# Patient Record
Sex: Male | Born: 1955 | Race: Black or African American | Hispanic: No | Smoking: Current every day smoker
Health system: Southern US, Community
[De-identification: ages and names within clinical notes are randomized; demographics above are authoritative.]

## PROBLEM LIST (undated history)

## (undated) DIAGNOSIS — E049 Nontoxic goiter, unspecified: Secondary | ICD-10-CM

## (undated) DIAGNOSIS — M1990 Unspecified osteoarthritis, unspecified site: Secondary | ICD-10-CM

## (undated) DIAGNOSIS — K219 Gastro-esophageal reflux disease without esophagitis: Secondary | ICD-10-CM

## (undated) DIAGNOSIS — I10 Essential (primary) hypertension: Secondary | ICD-10-CM

## (undated) HISTORY — PX: HERNIA REPAIR: SHX51

## (undated) HISTORY — PX: HIP SURGERY: SHX245

---

## 2014-07-19 ENCOUNTER — Other Ambulatory Visit: Payer: Self-pay | Admitting: Orthopedic Surgery

## 2014-07-19 NOTE — Progress Notes (Signed)
Preoperative surgical orders have been place into the [facility] hospital system for Cody Morales on 07/19/2014, 12:52 PM  by Patrica Duel for surgery on 08/15/2014.  Preop Total Hip orders including Experel Injecion, IV Tylenol, and IV Decadron as long as there are no contraindications to the above medications. Avel Peace, PA-C

## 2014-07-31 ENCOUNTER — Other Ambulatory Visit: Payer: Self-pay | Admitting: Orthopedic Surgery

## 2014-07-31 NOTE — H&P (Signed)
Cody Morales DOB: 1956-08-08 Divorced / Language: [place] / Race: Black or African American, White Male Date of Admission: 08-15-2014 Chief Complaint:  Right Hip Pain History of Present Illness  The patient is a 58 year old male who comes in for a preoperative history and physical. The patient is scheduled for a right total hip arthroplasty to be performed by Dr. Gus Rankin. Aluisio, MD at [facility] on 08/15/2014. The patient reports left hip and right hip problems including pain, stiffness and decreased range of motion symptoms that have been present for year(s). 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 He has had surgeries in the past hip pinnings and then the had hardware taken out. He was doing well but over time has started to have increased soreness and stiffness. In the last two years he has lost range of motion. He was told he needed hip replacements and was referred to Dr.Aluisio. He is currently taking hydrocodone as needed.).The patient feels as if their symptoms are does feel they are worsening. Unfortunately, he has severe problems with both hips. His right hip is currently more symptomatic and problematic than the left. He has pain in both occurring at all times. This definitely limits his mobility and limits what he can and can not do. The problems date back to his teen years when he had pinning for slipped capital femoral epiphysis. He had a subsequent hardware removal. He has had rotational osteotomies. He is at a stage now where his hips hurt him at all times. He can not do anything he desires. At this point, the only predictable means of improving his pain and function will be a total hip arthroplasty. He is not a candidate for an anterior approach on the right given the significant deformity and the essentially fused hip. He will have to have a posterior approach. We did discuss this in detail, including the  procedure, risks,potential complications and rehab course. He wants to go ahead and proceed with a right total hip arthroplasty first. He can eventually have the left hip done and that could probably be done via an anterior approach. They have been treated conservatively in the past for the above stated problem and despite conservative measures, they continue to have progressive pain and severe functional limitations and dysfunction. They have failed non-operative management including home exercise, medications, and injections. It is felt that they would benefit from undergoing total joint replacement. Risks and benefits of the procedure have been discussed with the patient and they elect to proceed with surgery. There are no active contraindications to surgery such as ongoing infection or rapidly progressive neurological disease.  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 Heart disease in male family member before age 57 First Degree Relatives reported  Social History  No history of drug/alcohol rehab Not under pain contract Marital status divorced Exercise Exercises rarely; does running / walking Living situation live with partner Tobacco use Current some day smoker. 05/01/2014: smoke(d) 3 or more pack(s) per day Number of flights of stairs before winded 2-3 Tobacco / smoke exposure 05/01/2014: yes Current drinker 05/01/2014: Currently drinks wine 5-7 times per week Current work status retired Children 1  Medication History Diclofenac Sodium ER (  Tablet ER 24HR, Oral) Active. Hydrocodone-Acetaminophen (5-325MG  Tablet, Oral) Active.  Nifedical XL (  Tablet ER 24HR, Oral) Active. TraZODone HCl (  Tablet, Oral daily) Active. TraMADol HCl Active.  Past  Surgical History Inguinal Hernia Repair open: bilateral Bilateral Hip Pinning for SCFE's   Review of Systems General Not Present- Chills, Fatigue, Fever, Memory Loss, Night Sweats, Weight Gain and Weight Loss. Skin Not Present- Eczema, Hives, Itching, Lesions and Rash. HEENT Not Present- Dentures, Double Vision, Headache, Hearing Loss, Tinnitus and Visual Loss. Respiratory Not Present- Allergies, Chronic Cough, Coughing up blood, Shortness of breath at rest and Shortness of breath with exertion. Cardiovascular Not Present- Chest Pain, Difficulty Breathing Lying Down, Murmur, Palpitations, Racing/skipping heartbeats and Swelling. Gastrointestinal Not Present- Abdominal Pain, Bloody Stool, Constipation, Diarrhea, Difficulty Swallowing, Heartburn, Jaundice, Loss of appetitie, Nausea and Vomiting. Male Genitourinary Not Present- Blood in Urine, Discharge, Flank Pain, Incontinence, Painful Urination, Urgency, Urinary frequency, Urinary Retention, Urinating at Night and Weak urinary stream. Musculoskeletal Present- Morning Stiffness and Muscle Pain. Not Present- Back Pain, Joint Pain, Joint Swelling, Muscle Weakness and Spasms. Neurological Not Present- Blackout spells, Difficulty with balance, Dizziness, Paralysis, Tremor and Weakness. Psychiatric Not Present- Insomnia.   Vitals Weight: 216 lb Height: 70in Body Surface Area: 2.2 m Body Mass Index: 30.99 kg/m BP: 124/92 (Sitting, Right Arm, Standard)  Physical Exam  General Mental Status -Alert, cooperative and good historian. General Appearance-pleasant, Not in acute distress. Orientation-Oriented X3. Build & Nutrition-Well nourished and Well developed. Gait-Antalgic and Limping.  Head and Neck Head-normocephalic, atraumatic . Neck Global Assessment - supple, no bruit auscultated on the right, no bruit auscultated on the left.  Eye Pupil - Bilateral-Regular and Round. Motion - Bilateral-EOMI.  Chest and  Lung Exam Auscultation Breath sounds - clear at anterior chest wall and clear at posterior chest wall. Adventitious sounds - No Adventitious sounds.  Cardiovascular Auscultation Rhythm - Regular rate and rhythm. Heart Sounds - S1 WNL and S2 WNL. Murmurs & Other Heart Sounds - Auscultation of the heart reveals - No Murmurs.  Abdomen Palpation/Percussion Tenderness - Abdomen is non-tender to palpation. Rigidity (guarding) - Abdomen is soft. Auscultation Auscultation of the abdomen reveals - Bowel sounds normal.  Male Genitourinary Note: Not done, not pertinent to present illness   Musculoskeletal Note: He is a well developed male. He is alert and oriented. No apparent distress. The right hip has flexion to about 70, zero internal or external rotation. No abduction. The left hip flexion is to about 90, about 5-10 external rotation and about 10 degrees of abduction. The knee exams are normal. Pulse, sensation and motor are intact in both lower extremities. He has a very stiff legged antalgic gait pattern.  RADIOGRAPHS: AP pelvis and lateral of both hips show severe end stage arthritic change, right worse than left hip. 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 On the left he also has severe bone on bone arthritis with subchondral cystic formation but less deformity.  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 Lequita Halt.  Plan is to go home.  PCP - Dr. Liliane Shi  The patient will receive topical TXA (tranexamic acid) due to: DVT in 1996  Signed electronically by Zackarey Holleman Tessie Fass, III PA-C

## 2014-08-02 ENCOUNTER — Encounter (HOSPITAL_COMMUNITY): Payer: Self-pay | Admitting: Pharmacy Technician

## 2014-08-08 NOTE — Patient Instructions (Addendum)
Cody Morales  08/08/2014   Your procedure is scheduled on:  08/15/2014    Report to [facility].  Follow the Signs to Short Stay Center at   230pm  Call this number if you have problems the morning of surgery: [phone]   Remember:   Do not eat food after midnite.  May have clear liquids until 0930am then npo.    Take these medicines the morning of surgery with A SIP OF WATER: Procardia    Do not wear jewelry,   Do not wear lotions, powders, or perfumes.  deodorant.  . Men may shave face and neck.  Do not bring valuables to the hospital.  Contacts, dentures or bridgework may not be worn into surgery.  Leave suitcase in the car. After surgery it may be brought to your room.  For patients admitted to the hospital, checkout time is 11:00 AM the day of  discharge.          CLEAR LIQUID DIET   Foods Allowed                                                                     Foods Excluded  Coffee and tea, regular and decaf                             liquids that you cannot  Plain Jell-O in any flavor                                             see through such as: Fruit ices (not with fruit pulp)                                     milk, soups, orange juice  Iced Popsicles                                    All solid food Carbonated beverages, regular and diet                                    Cranberry, grape and apple juices Sports drinks like Gatorade Lightly seasoned clear broth or consume(fat free) Sugar, honey syrup  Sample Menu Breakfast                                Lunch                                     Supper Cranberry juice                    Beef broth  Chicken broth Jell-O                                     Grape juice                           Apple juice Coffee or tea                        Jell-O                                      Popsicle                                                Coffee or tea                         Coffee or tea  _____________________________________________________________________  [facility] - Preparing for Surgery Before surgery, you can play an important role.  Because skin is not sterile, your skin needs to be as free of germs as possible.  You can reduce the number of germs on your skin by washing with CHG (chlorahexidine gluconate) soap before surgery.  CHG is an antiseptic cleaner which kills germs and bonds with the skin to continue killing germs even after washing. Please DO NOT use if you have an allergy to CHG or antibacterial soaps.  If your skin becomes reddened/irritated stop using the CHG and inform your nurse when you arrive at Short Stay. Do not shave (including legs and underarms) for at least 48 hours prior to the first CHG shower.  You may shave your face/neck. Please follow these instructions carefully:  1.  Shower with CHG Soap the night before surgery and the  morning of Surgery.  2.  If you choose to wash your hair, wash your hair first as usual with your  normal  shampoo.  3.  After you shampoo, rinse your hair and body thoroughly to remove the  shampoo.                           4.  Use CHG as you would any other liquid soap.  You can apply chg directly  to the skin and wash                       Gently with a scrungie or clean washcloth.  5.  Apply the CHG Soap to your body ONLY FROM THE NECK DOWN.   Do not use on face/ open                           Wound or open sores. Avoid contact with eyes, ears mouth and genitals (private parts).                       Wash face,  Genitals (private parts) with your normal soap.             6.  Wash thoroughly, paying special attention to the area  where your surgery  will be performed.  7.  Thoroughly rinse your body with warm water from the neck down.  8.  DO NOT shower/wash with your normal soap after using and rinsing off  the CHG Soap.                9.  Pat yourself dry with a clean towel.            10.  Wear  clean pajamas.            11.  Place clean sheets on your bed the night of your first shower and do not  sleep with pets. Day of Surgery : Do not apply any lotions/deodorants the morning of surgery.  Please wear clean clothes to the hospital/surgery center.  FAILURE TO FOLLOW THESE INSTRUCTIONS MAY RESULT IN THE CANCELLATION OF YOUR SURGERY PATIENT SIGNATURE_________________________________  NURSE SIGNATURE__________________________________  ________________________________________________________________________  WHAT IS A BLOOD TRANSFUSION? Blood Transfusion Information  A transfusion is the replacement of blood or some of its parts. Blood is made up of multiple cells which provide different functions.  Red blood cells carry oxygen and are used for blood loss replacement.  White blood cells fight against infection.  Platelets control bleeding.  Plasma helps clot blood.  Other blood products are available for specialized needs, such as hemophilia or other clotting disorders. BEFORE THE TRANSFUSION  Who gives blood for transfusions?   Healthy volunteers who are fully evaluated to make sure their blood is safe. This is blood bank blood. Transfusion therapy is the safest it has ever been in the practice of medicine. Before blood is taken from a donor, a complete history is taken to make sure that person has no history of diseases nor engages in risky social behavior (examples are intravenous drug use or sexual activity with multiple partners). The donor's travel history is screened to minimize risk of transmitting infections, such as malaria. The donated blood is tested for signs of infectious diseases, such as HIV and hepatitis. The blood is then tested to be sure it is compatible with you in order to minimize the chance of a transfusion reaction. If you or a relative donates blood, this is often done in anticipation of surgery and is not appropriate for emergency situations. It takes  many days to process the donated blood. RISKS AND COMPLICATIONS Although transfusion therapy is very safe and saves many lives, the main dangers of transfusion include:   Getting an infectious disease.  Developing a transfusion reaction. This is an allergic reaction to something in the blood you were given. Every precaution is taken to prevent this. The decision to have a blood transfusion has been considered carefully by your caregiver before blood is given. Blood is not given unless the benefits outweigh the risks. AFTER THE TRANSFUSION  Right after receiving a blood transfusion, you will usually feel much better and more energetic. This is especially true if your red blood cells have gotten low (anemic). The transfusion raises the level of the red blood cells which carry oxygen, and this usually causes an energy increase.  The nurse administering the transfusion will monitor you carefully for complications. HOME CARE INSTRUCTIONS  No special instructions are needed after a transfusion. You may find your energy is better. Speak with your caregiver about any limitations on activity for underlying diseases you may have. SEEK MEDICAL CARE IF:   Your condition is not improving after your transfusion.  You develop redness or irritation at the intravenous (  IV) site. SEEK IMMEDIATE MEDICAL CARE IF:  Any of the following symptoms occur over the next 12 hours:  Shaking chills.  You have a temperature by mouth above 102 F (38.9 C), not controlled by medicine.  Chest, back, or muscle pain.  People around you feel you are not acting correctly or are confused.  Shortness of breath or difficulty breathing.  Dizziness and fainting.  You get a rash or develop hives.  You have a decrease in urine output.  Your urine turns a dark color or changes to pink, red, or brown. Any of the following symptoms occur over the next 10 days:  You have a temperature by mouth above 102 F (38.9 C), not  controlled by medicine.  Shortness of breath.  Weakness after normal activity.  The white part of the eye turns yellow (jaundice).  You have a decrease in the amount of urine or are urinating less often.  Your urine turns a dark color or changes to pink, red, or brown. Document Released: 10/30/2000 Document Revised: 01/25/2012 Document Reviewed: 06/18/2008 Exit[employer] Patient Information 2014 [place].  _______________________________________________________________________  Incentive Spirometer  An incentive spirometer is a tool that can help keep your lungs clear and active. This tool measures how well you are filling your lungs with each breath. Taking long deep breaths may help reverse or decrease the chance of developing breathing (pulmonary) problems (especially infection) following:  A long period of time when you are unable to move or be active. BEFORE THE PROCEDURE   If the spirometer includes an indicator to show your best effort, your nurse or respiratory therapist will set it to a desired goal.  If possible, sit up straight or lean slightly forward. Try not to slouch.  Hold the incentive spirometer in an upright position. INSTRUCTIONS FOR USE  1. Sit on the edge of your bed if possible, or sit up as far as you can in bed or on a chair. 2. Hold the incentive spirometer in an upright position. 3. Breathe out normally. 4. Place the mouthpiece in your mouth and seal your lips tightly around it. 5. Breathe in slowly and as deeply as possible, raising the piston or the ball toward the top of the column. 6. Hold your breath for 3-5 seconds or for as long as possible. Allow the piston or ball to fall to the bottom of the column. 7. Remove the mouthpiece from your mouth and breathe out normally. 8. Rest for a few seconds and repeat Steps 1 through 7 at least 10 times every 1-2 hours when you are awake. Take your time and take a few normal breaths between deep  breaths. 9. The spirometer may include an indicator to show your best effort. Use the indicator as a goal to work toward during each repetition. 10. After each set of 10 deep breaths, practice coughing to be sure your lungs are clear. If you have an incision (the cut made at the time of surgery), support your incision when coughing by placing a pillow or rolled up towels firmly against it. Once you are able to get out of bed, walk around indoors and cough well. You may stop using the incentive spirometer when instructed by your caregiver.  RISKS AND COMPLICATIONS  Take your time so you do not get dizzy or light-headed.  If you are in pain, you may need to take or ask for pain medication before doing incentive spirometry. It is harder to take a deep breath if you  are having pain. AFTER USE  Rest and breathe slowly and easily.  It can be helpful to keep track of a log of your progress. Your caregiver can provide you with a simple table to help with this. If you are using the spirometer at home, follow these instructions: SEEK MEDICAL CARE IF:   You are having difficultly using the spirometer.  You have trouble using the spirometer as often as instructed.  Your pain medication is not giving enough relief while using the spirometer.  You develop fever of 100.5 F (38.1 C) or higher. SEEK IMMEDIATE MEDICAL CARE IF:   You cough up bloody sputum that had not been present before.  You develop fever of 102 F (38.9 C) or greater.  You develop worsening pain at or near the incision site. MAKE SURE YOU:   Understand these instructions.  Will watch your condition.  Will get help right away if you are not doing well or get worse. Document Released: 03/15/2007 Document Revised: 01/25/2012 Document Reviewed: 05/16/2007 ExitCare Patient Information 2014 ExitCare, [place].   ________________________________________________________________________    Please read over the following fact  sheets that you were given: MRSA Information, coughing and deep breathing exercises, leg exercises

## 2014-08-09 ENCOUNTER — Encounter (INDEPENDENT_AMBULATORY_CARE_PROVIDER_SITE_OTHER): Payer: Self-pay

## 2014-08-09 ENCOUNTER — Ambulatory Visit (HOSPITAL_COMMUNITY)
Admission: RE | Admit: 2014-08-09 | Discharge: 2014-08-09 | Disposition: A | Discharge status: Home / Self Care | Payer: Medicare HMO | Source: Ambulatory Visit | Attending: Orthopedic Surgery | Admitting: Orthopedic Surgery

## 2014-08-09 ENCOUNTER — Encounter (HOSPITAL_COMMUNITY): Payer: Self-pay

## 2014-08-09 ENCOUNTER — Encounter (HOSPITAL_COMMUNITY)
Admission: RE | Admit: 2014-08-09 | Discharge: 2014-08-09 | Disposition: A | Discharge status: Home / Self Care | Payer: Medicare HMO | Source: Ambulatory Visit | Attending: Orthopedic Surgery | Admitting: Orthopedic Surgery

## 2014-08-09 DIAGNOSIS — I10 Essential (primary) hypertension: Secondary | ICD-10-CM | POA: Diagnosis present

## 2014-08-09 HISTORY — DX: Nontoxic goiter, unspecified: E04.9

## 2014-08-09 HISTORY — DX: Essential (primary) hypertension: I10

## 2014-08-09 HISTORY — DX: Gastro-esophageal reflux disease without esophagitis: K21.9

## 2014-08-09 HISTORY — DX: Unspecified osteoarthritis, unspecified site: M19.90

## 2014-08-09 LAB — CBC
HCT: 43.3 % (ref 39.0–52.0)
Hemoglobin: 14.9 g/dL (ref 13.0–17.0)
MCH: 32.2 pg (ref 26.0–34.0)
MCHC: 34.4 g/dL (ref 30.0–36.0)
MCV: 93.5 fL (ref 78.0–100.0)
PLATELETS: 170 10*3/uL (ref 150–400)
RBC: 4.63 MIL/uL (ref 4.22–5.81)
RDW: 13.7 % (ref 11.5–15.5)
WBC: 6.8 10*3/uL (ref 4.0–10.5)

## 2014-08-09 LAB — URINALYSIS, ROUTINE W REFLEX MICROSCOPIC
Glucose, UA: NEGATIVE mg/dL
Hgb urine dipstick: NEGATIVE
Ketones, ur: NEGATIVE mg/dL
LEUKOCYTES UA: NEGATIVE
Nitrite: NEGATIVE
Protein, ur: NEGATIVE mg/dL
SPECIFIC GRAVITY, URINE: 1.034 — AB (ref 1.005–1.030)
Urobilinogen, UA: 0.2 mg/dL (ref 0.0–1.0)
pH: 5.5 (ref 5.0–8.0)

## 2014-08-09 LAB — SURGICAL PCR SCREEN
MRSA, PCR: NEGATIVE
STAPHYLOCOCCUS AUREUS: POSITIVE — AB

## 2014-08-09 LAB — PROTIME-INR
INR: 1.01 (ref 0.00–1.49)
Prothrombin Time: 13.3 seconds (ref 11.6–15.2)

## 2014-08-09 LAB — COMPREHENSIVE METABOLIC PANEL
ALBUMIN: 3.7 g/dL (ref 3.5–5.2)
ALT: 16 U/L (ref 0–53)
AST: 19 U/L (ref 0–37)
Alkaline Phosphatase: 72 U/L (ref 39–117)
Anion gap: 13 (ref 5–15)
BILIRUBIN TOTAL: 0.3 mg/dL (ref 0.3–1.2)
BUN: 15 mg/dL (ref 6–23)
CHLORIDE: 102 meq/L (ref 96–112)
CO2: 23 meq/L (ref 19–32)
Calcium: 8.9 mg/dL (ref 8.4–10.5)
Creatinine, Ser: 0.85 mg/dL (ref 0.50–1.35)
GFR calc Af Amer: >90 mL/min (ref >90)
GFR calc non Af Amer: >90 mL/min (ref >90)
Glucose, Bld: 101 mg/dL — ABNORMAL HIGH (ref 70–99)
Potassium: 4.1 mEq/L (ref 3.7–5.3)
Sodium: 138 mEq/L (ref 137–147)
Total Protein: 7.3 g/dL (ref 6.0–8.3)

## 2014-08-09 LAB — ABO/RH: ABO/RH(D): O POS

## 2014-08-09 LAB — APTT: APTT: 26 s (ref 24–37)

## 2014-08-09 NOTE — Progress Notes (Signed)
Faxed medical release form to Dr Britt Bottom requesting last EKG, office visit note and labs.

## 2014-08-09 NOTE — Progress Notes (Signed)
Dr Jeronimo Greaves - 07/16/2014 clearance on chart

## 2014-08-10 NOTE — Progress Notes (Signed)
Rerequested info from Dr Jeronimo Greaves to include most recent ekg.  This is second request.

## 2014-08-10 NOTE — Progress Notes (Signed)
LOV note- Dr Sharion Settler - 06/2014 on chasrt  EKG-07/16/2014 on chart

## 2014-08-15 ENCOUNTER — Encounter (HOSPITAL_COMMUNITY): Payer: Medicare HMO | Admitting: Anesthesiology

## 2014-08-15 ENCOUNTER — Encounter (HOSPITAL_COMMUNITY)
Admission: RE | Disposition: A | Discharge status: Home Health | Payer: Self-pay | Source: Ambulatory Visit | Attending: Orthopedic Surgery

## 2014-08-15 ENCOUNTER — Inpatient Hospital Stay (HOSPITAL_COMMUNITY)
Admission: RE | Admit: 2014-08-15 | Discharge: 2014-08-19 | DRG: 470 | Disposition: A | Discharge status: Home Health | Payer: Medicare HMO | Source: Ambulatory Visit | Attending: Orthopedic Surgery | Admitting: Orthopedic Surgery

## 2014-08-15 ENCOUNTER — Inpatient Hospital Stay (HOSPITAL_COMMUNITY): Payer: Medicare HMO | Admitting: Anesthesiology

## 2014-08-15 ENCOUNTER — Encounter (HOSPITAL_COMMUNITY): Payer: Self-pay | Admitting: *Deleted

## 2014-08-15 ENCOUNTER — Inpatient Hospital Stay (HOSPITAL_COMMUNITY): Payer: Medicare HMO

## 2014-08-15 DIAGNOSIS — Z86718 Personal history of other venous thrombosis and embolism: Secondary | ICD-10-CM | POA: Diagnosis not present

## 2014-08-15 DIAGNOSIS — M1610 Unilateral primary osteoarthritis, unspecified hip: Secondary | ICD-10-CM | POA: Diagnosis not present

## 2014-08-15 DIAGNOSIS — F17210 Nicotine dependence, cigarettes, uncomplicated: Secondary | ICD-10-CM | POA: Diagnosis present

## 2014-08-15 DIAGNOSIS — Z8739 Personal history of other diseases of the musculoskeletal system and connective tissue: Secondary | ICD-10-CM

## 2014-08-15 DIAGNOSIS — M169 Osteoarthritis of hip, unspecified: Secondary | ICD-10-CM | POA: Diagnosis present

## 2014-08-15 DIAGNOSIS — Z8249 Family history of ischemic heart disease and other diseases of the circulatory system: Secondary | ICD-10-CM | POA: Diagnosis not present

## 2014-08-15 DIAGNOSIS — M1611 Unilateral primary osteoarthritis, right hip: Principal | ICD-10-CM | POA: Diagnosis present

## 2014-08-15 DIAGNOSIS — K219 Gastro-esophageal reflux disease without esophagitis: Secondary | ICD-10-CM | POA: Diagnosis present

## 2014-08-15 DIAGNOSIS — I10 Essential (primary) hypertension: Secondary | ICD-10-CM | POA: Diagnosis present

## 2014-08-15 DIAGNOSIS — Z6830 Body mass index (BMI) 30.0-30.9, adult: Secondary | ICD-10-CM | POA: Diagnosis not present

## 2014-08-15 DIAGNOSIS — M25551 Pain in right hip: Secondary | ICD-10-CM | POA: Diagnosis present

## 2014-08-15 DIAGNOSIS — F17200 Nicotine dependence, unspecified, uncomplicated: Secondary | ICD-10-CM | POA: Diagnosis not present

## 2014-08-15 DIAGNOSIS — M1651 Unilateral post-traumatic osteoarthritis, right hip: Secondary | ICD-10-CM

## 2014-08-15 HISTORY — PX: TOTAL HIP ARTHROPLASTY: SHX124

## 2014-08-15 LAB — TYPE AND SCREEN
ABO/RH(D): O POS
ANTIBODY SCREEN: NEGATIVE

## 2014-08-15 SURGERY — ARTHROPLASTY, HIP, TOTAL,POSTERIOR APPROACH
Anesthesia: General | Site: Hip | Laterality: Right

## 2014-08-15 MED ORDER — HYDROMORPHONE HCL 2 MG/ML IJ SOLN
INTRAMUSCULAR | Status: AC
  Filled 2014-08-15: qty 1

## 2014-08-15 MED ORDER — CHLORHEXIDINE GLUCONATE 4 % EX LIQD: 60.0000 mL | Freq: Once | CUTANEOUS | Status: DC

## 2014-08-15 MED ORDER — DOCUSATE SODIUM 100 MG PO CAPS
100.0000 mg | ORAL_CAPSULE | Freq: Two times a day (BID) | ORAL | Status: DC
  Administered 2014-08-15 – 2014-08-19 (×8): 100 mg via ORAL

## 2014-08-15 MED ORDER — ACETAMINOPHEN 325 MG PO TABS
650.0000 mg | ORAL_TABLET | Freq: Four times a day (QID) | ORAL | Status: DC | PRN
  Administered 2014-08-17: 650 mg via ORAL
  Filled 2014-08-15: qty 2

## 2014-08-15 MED ORDER — GLYCOPYRROLATE 0.2 MG/ML IJ SOLN
INTRAMUSCULAR | Status: AC
Start: 2014-08-15 — End: 2014-08-15
  Filled 2014-08-15: qty 3

## 2014-08-15 MED ORDER — CEFAZOLIN SODIUM-DEXTROSE 2-3 GM-% IV SOLR
2.0000 g | Freq: Four times a day (QID) | INTRAVENOUS | Status: AC
  Administered 2014-08-15 – 2014-08-16 (×2): 2 g via INTRAVENOUS
  Filled 2014-08-15 (×2): qty 50

## 2014-08-15 MED ORDER — ONDANSETRON HCL 4 MG/2ML IJ SOLN
INTRAMUSCULAR | Status: DC | PRN
  Administered 2014-08-15: 4 mg via INTRAVENOUS

## 2014-08-15 MED ORDER — POLYETHYLENE GLYCOL 3350 17 G PO PACK: 17.0000 g | PACK | Freq: Every day | ORAL | Status: DC | PRN

## 2014-08-15 MED ORDER — RIVAROXABAN 10 MG PO TABS
10.0000 mg | ORAL_TABLET | Freq: Every day | ORAL | Status: DC
  Administered 2014-08-16 – 2014-08-19 (×4): 10 mg via ORAL
  Filled 2014-08-15 (×5): qty 1

## 2014-08-15 MED ORDER — MENTHOL 3 MG MT LOZG
1.0000 | LOZENGE | OROMUCOSAL | Status: DC | PRN
Start: 2014-08-15 — End: 2014-08-19
  Filled 2014-08-15: qty 9

## 2014-08-15 MED ORDER — MORPHINE SULFATE 2 MG/ML IJ SOLN
INTRAMUSCULAR | Status: AC
  Filled 2014-08-15: qty 1

## 2014-08-15 MED ORDER — OXYCODONE HCL 5 MG PO TABS
5.0000 mg | ORAL_TABLET | ORAL | Status: DC | PRN
  Administered 2014-08-16 – 2014-08-19 (×17): 10 mg via ORAL
  Filled 2014-08-15 (×17): qty 2

## 2014-08-15 MED ORDER — DIPHENHYDRAMINE HCL 12.5 MG/5ML PO ELIX: 12.5000 mg | ORAL_SOLUTION | ORAL | Status: DC | PRN

## 2014-08-15 MED ORDER — METHOCARBAMOL 1000 MG/10ML IJ SOLN
500.0000 mg | Freq: Four times a day (QID) | INTRAVENOUS | Status: DC | PRN
  Administered 2014-08-15: 500 mg via INTRAVENOUS
  Filled 2014-08-15: qty 5

## 2014-08-15 MED ORDER — ACETAMINOPHEN 500 MG PO TABS
1000.0000 mg | ORAL_TABLET | Freq: Four times a day (QID) | ORAL | Status: AC
  Administered 2014-08-15 – 2014-08-16 (×4): 1000 mg via ORAL
  Filled 2014-08-15 (×5): qty 2

## 2014-08-15 MED ORDER — SODIUM CHLORIDE 0.9 % IV SOLN
INTRAVENOUS | Status: DC
Start: 2014-08-15 — End: 2014-08-15

## 2014-08-15 MED ORDER — FAMOTIDINE 40 MG PO TABS
40.0000 mg | ORAL_TABLET | Freq: Every day | ORAL | Status: DC | PRN
  Filled 2014-08-15: qty 1

## 2014-08-15 MED ORDER — BUPIVACAINE LIPOSOME 1.3 % IJ SUSP
20.0000 mL | Freq: Once | INTRAMUSCULAR | Status: DC
  Filled 2014-08-15: qty 20

## 2014-08-15 MED ORDER — DEXAMETHASONE SODIUM PHOSPHATE 10 MG/ML IJ SOLN
INTRAMUSCULAR | Status: AC
  Filled 2014-08-15: qty 1

## 2014-08-15 MED ORDER — CEFAZOLIN SODIUM-DEXTROSE 2-3 GM-% IV SOLR
INTRAVENOUS | Status: AC
  Filled 2014-08-15: qty 50

## 2014-08-15 MED ORDER — PHENOL 1.4 % MT LIQD
1.0000 | OROMUCOSAL | Status: DC | PRN
  Filled 2014-08-15: qty 177

## 2014-08-15 MED ORDER — LACTATED RINGERS IV SOLN: INTRAVENOUS | Status: DC

## 2014-08-15 MED ORDER — LIDOCAINE HCL (CARDIAC) 20 MG/ML IV SOLN
INTRAVENOUS | Status: DC | PRN
  Administered 2014-08-15: 75 mg via INTRAVENOUS

## 2014-08-15 MED ORDER — SUCCINYLCHOLINE CHLORIDE 20 MG/ML IJ SOLN
INTRAMUSCULAR | Status: DC | PRN
  Administered 2014-08-15: 100 mg via INTRAVENOUS

## 2014-08-15 MED ORDER — CISATRACURIUM BESYLATE 20 MG/10ML IV SOLN
INTRAVENOUS | Status: AC
  Filled 2014-08-15: qty 10

## 2014-08-15 MED ORDER — METOCLOPRAMIDE HCL 10 MG PO TABS: 5.0000 mg | ORAL_TABLET | Freq: Three times a day (TID) | ORAL | Status: DC | PRN

## 2014-08-15 MED ORDER — HYDROMORPHONE HCL 1 MG/ML IJ SOLN
0.2500 mg | INTRAMUSCULAR | Status: DC | PRN
  Administered 2014-08-15: 0.5 mg via INTRAVENOUS

## 2014-08-15 MED ORDER — BUPIVACAINE HCL (PF) 0.25 % IJ SOLN
INTRAMUSCULAR | Status: AC
  Filled 2014-08-15: qty 30

## 2014-08-15 MED ORDER — NEOSTIGMINE METHYLSULFATE 10 MG/10ML IV SOLN
INTRAVENOUS | Status: AC
  Filled 2014-08-15: qty 1

## 2014-08-15 MED ORDER — FLEET ENEMA 7-19 GM/118ML RE ENEM: 1.0000 | ENEMA | Freq: Once | RECTAL | Status: AC | PRN

## 2014-08-15 MED ORDER — FENTANYL CITRATE 0.05 MG/ML IJ SOLN
INTRAMUSCULAR | Status: DC | PRN
  Administered 2014-08-15: 100 ug via INTRAVENOUS
  Administered 2014-08-15: 50 ug via INTRAVENOUS
  Administered 2014-08-15 (×2): 100 ug via INTRAVENOUS

## 2014-08-15 MED ORDER — CEFAZOLIN SODIUM-DEXTROSE 2-3 GM-% IV SOLR
2.0000 g | INTRAVENOUS | Status: AC
  Administered 2014-08-15: 2 g via INTRAVENOUS

## 2014-08-15 MED ORDER — ONDANSETRON HCL 4 MG/2ML IJ SOLN
INTRAMUSCULAR | Status: AC
  Filled 2014-08-15: qty 2

## 2014-08-15 MED ORDER — BISACODYL 10 MG RE SUPP: 10.0000 mg | Freq: Every day | RECTAL | Status: DC | PRN

## 2014-08-15 MED ORDER — GLYCOPYRROLATE 0.2 MG/ML IJ SOLN
INTRAMUSCULAR | Status: DC | PRN
  Administered 2014-08-15: 0.6 mg via INTRAVENOUS

## 2014-08-15 MED ORDER — TRANEXAMIC ACID 100 MG/ML IV SOLN
2000.0000 mg | INTRAVENOUS | Status: DC | PRN
  Administered 2014-08-15: 2000 mg via TOPICAL

## 2014-08-15 MED ORDER — FENTANYL CITRATE 0.05 MG/ML IJ SOLN
INTRAMUSCULAR | Status: AC
  Filled 2014-08-15: qty 5

## 2014-08-15 MED ORDER — BUPIVACAINE LIPOSOME 1.3 % IJ SUSP
INTRAMUSCULAR | Status: DC | PRN
  Administered 2014-08-15: 20 mL

## 2014-08-15 MED ORDER — NEOSTIGMINE METHYLSULFATE 10 MG/10ML IV SOLN
INTRAVENOUS | Status: DC | PRN
  Administered 2014-08-15: 4 mg via INTRAVENOUS

## 2014-08-15 MED ORDER — DEXAMETHASONE SODIUM PHOSPHATE 10 MG/ML IJ SOLN
10.0000 mg | Freq: Once | INTRAMUSCULAR | Status: AC
  Administered 2014-08-15: 10 mg via INTRAVENOUS

## 2014-08-15 MED ORDER — PROPOFOL 10 MG/ML IV BOLUS
INTRAVENOUS | Status: AC
  Filled 2014-08-15: qty 20

## 2014-08-15 MED ORDER — SODIUM CHLORIDE 0.9 % IV SOLN
INTRAVENOUS | Status: DC
  Administered 2014-08-15: 23:00:00 via INTRAVENOUS

## 2014-08-15 MED ORDER — ACETAMINOPHEN 10 MG/ML IV SOLN
1000.0000 mg | Freq: Once | INTRAVENOUS | Status: AC
  Administered 2014-08-15: 1000 mg via INTRAVENOUS
  Filled 2014-08-15: qty 100

## 2014-08-15 MED ORDER — MIDAZOLAM HCL 2 MG/2ML IJ SOLN
INTRAMUSCULAR | Status: AC
  Filled 2014-08-15: qty 2

## 2014-08-15 MED ORDER — LACTATED RINGERS IV SOLN
INTRAVENOUS | Status: DC | PRN
  Administered 2014-08-15 (×4): via INTRAVENOUS

## 2014-08-15 MED ORDER — SODIUM CHLORIDE 0.9 % IJ SOLN
INTRAMUSCULAR | Status: AC
  Filled 2014-08-15: qty 50

## 2014-08-15 MED ORDER — BUPIVACAINE HCL 0.25 % IJ SOLN
INTRAMUSCULAR | Status: DC | PRN
  Administered 2014-08-15: 20 mL

## 2014-08-15 MED ORDER — PROPOFOL 10 MG/ML IV BOLUS
INTRAVENOUS | Status: DC | PRN
  Administered 2014-08-15: 200 mg via INTRAVENOUS

## 2014-08-15 MED ORDER — ACETAMINOPHEN 650 MG RE SUPP: 650.0000 mg | Freq: Four times a day (QID) | RECTAL | Status: DC | PRN

## 2014-08-15 MED ORDER — CISATRACURIUM BESYLATE (PF) 10 MG/5ML IV SOLN
INTRAVENOUS | Status: DC | PRN
  Administered 2014-08-15: 8 mg via INTRAVENOUS
  Administered 2014-08-15: 2 mg via INTRAVENOUS

## 2014-08-15 MED ORDER — METHOCARBAMOL 500 MG PO TABS
500.0000 mg | ORAL_TABLET | Freq: Four times a day (QID) | ORAL | Status: DC | PRN
  Administered 2014-08-16 – 2014-08-19 (×9): 500 mg via ORAL
  Filled 2014-08-15 (×10): qty 1

## 2014-08-15 MED ORDER — MORPHINE SULFATE 2 MG/ML IJ SOLN
1.0000 mg | INTRAMUSCULAR | Status: DC | PRN
  Administered 2014-08-15: 2 mg via INTRAVENOUS
  Administered 2014-08-18: 1 mg via INTRAVENOUS
  Filled 2014-08-15: qty 1

## 2014-08-15 MED ORDER — MIDAZOLAM HCL 2 MG/2ML IJ SOLN
INTRAMUSCULAR | Status: DC | PRN
  Administered 2014-08-15: 2 mg via INTRAVENOUS

## 2014-08-15 MED ORDER — METOCLOPRAMIDE HCL 5 MG/ML IJ SOLN: 5.0000 mg | Freq: Three times a day (TID) | INTRAMUSCULAR | Status: DC | PRN

## 2014-08-15 MED ORDER — FENTANYL CITRATE 0.05 MG/ML IJ SOLN
INTRAMUSCULAR | Status: AC
Start: 2014-08-15 — End: 2014-08-15
  Filled 2014-08-15: qty 2

## 2014-08-15 MED ORDER — STERILE WATER FOR IRRIGATION IR SOLN
Status: DC | PRN
  Administered 2014-08-15: 1500 mL

## 2014-08-15 MED ORDER — HYDROMORPHONE HCL 1 MG/ML IJ SOLN
INTRAMUSCULAR | Status: DC | PRN
  Administered 2014-08-15 (×2): 1 mg via INTRAVENOUS

## 2014-08-15 MED ORDER — CISATRACURIUM BESYLATE (PF) 10 MG/5ML IV SOLN: INTRAVENOUS | Status: DC | PRN

## 2014-08-15 MED ORDER — ONDANSETRON HCL 4 MG PO TABS: 4.0000 mg | ORAL_TABLET | Freq: Four times a day (QID) | ORAL | Status: DC | PRN

## 2014-08-15 MED ORDER — DEXAMETHASONE SODIUM PHOSPHATE 10 MG/ML IJ SOLN: INTRAMUSCULAR | Status: DC | PRN

## 2014-08-15 MED ORDER — ONDANSETRON HCL 4 MG/2ML IJ SOLN
4.0000 mg | Freq: Four times a day (QID) | INTRAMUSCULAR | Status: DC | PRN
Start: 2014-08-15 — End: 2014-08-19

## 2014-08-15 MED ORDER — 0.9 % SODIUM CHLORIDE (POUR BTL) OPTIME
TOPICAL | Status: DC | PRN
  Administered 2014-08-15: 1000 mL

## 2014-08-15 MED ORDER — KETOROLAC TROMETHAMINE 15 MG/ML IJ SOLN: 7.5000 mg | Freq: Four times a day (QID) | INTRAMUSCULAR | Status: AC | PRN

## 2014-08-15 MED ORDER — HYDROMORPHONE HCL 1 MG/ML IJ SOLN
INTRAMUSCULAR | Status: AC
  Filled 2014-08-15: qty 1

## 2014-08-15 MED ORDER — TRAMADOL HCL 50 MG PO TABS
50.0000 mg | ORAL_TABLET | Freq: Four times a day (QID) | ORAL | Status: DC | PRN
  Administered 2014-08-15: 50 mg via ORAL
  Filled 2014-08-15: qty 1

## 2014-08-15 MED ORDER — TRANEXAMIC ACID 100 MG/ML IV SOLN
2000.0000 mg | Freq: Once | INTRAVENOUS | Status: DC
  Filled 2014-08-15: qty 20

## 2014-08-15 MED ORDER — TRAZODONE HCL 50 MG PO TABS
50.0000 mg | ORAL_TABLET | Freq: Every day | ORAL | Status: DC
  Administered 2014-08-15 – 2014-08-18 (×4): 50 mg via ORAL
  Filled 2014-08-15 (×5): qty 1

## 2014-08-15 MED ORDER — NIFEDIPINE ER 30 MG PO TB24
30.0000 mg | ORAL_TABLET | Freq: Every morning | ORAL | Status: DC
  Administered 2014-08-17: 30 mg via ORAL
  Filled 2014-08-15 (×4): qty 1

## 2014-08-15 MED ORDER — DEXAMETHASONE SODIUM PHOSPHATE 10 MG/ML IJ SOLN
10.0000 mg | Freq: Once | INTRAMUSCULAR | Status: DC
  Filled 2014-08-15: qty 1

## 2014-08-15 MED ORDER — SODIUM CHLORIDE 0.9 % IJ SOLN
INTRAMUSCULAR | Status: DC | PRN
  Administered 2014-08-15: 30 mL

## 2014-08-15 SURGICAL SUPPLY — 53 items
BAG ZIPLOCK 12X15 (MISCELLANEOUS) IMPLANT
BIT DRILL 2.8X128 (BIT) ×2 IMPLANT
BIT DRILL 2.8X128MM (BIT) ×1
BLADE EXTENDED COATED 6.5IN (ELECTRODE) ×3 IMPLANT
BLADE SAW SAG 73X25 THK (BLADE) ×2
BLADE SAW SGTL 73X25 THK (BLADE) ×1 IMPLANT
CAPT HIP PF COP ×3 IMPLANT
CLOSURE WOUND 1/2 X4 (GAUZE/BANDAGES/DRESSINGS) ×2
DECANTER SPIKE VIAL GLASS SM (MISCELLANEOUS) ×3 IMPLANT
DRAPE INCISE IOBAN 66X45 STRL (DRAPES) ×3 IMPLANT
DRAPE POUCH INSTRU U-SHP 10X18 (DRAPES) ×3 IMPLANT
DRAPE SPLIT 77X100IN (DRAPES) ×6 IMPLANT
DRAPE U-SHAPE 47X51 STRL (DRAPES) ×3 IMPLANT
DRSG ADAPTIC 3X8 NADH LF (GAUZE/BANDAGES/DRESSINGS) ×3 IMPLANT
DRSG MEPILEX BORDER 4X4 (GAUZE/BANDAGES/DRESSINGS) ×6 IMPLANT
DRSG MEPILEX BORDER 4X8 (GAUZE/BANDAGES/DRESSINGS) ×3 IMPLANT
DURAPREP 26ML APPLICATOR (WOUND CARE) ×3 IMPLANT
ELECT REM PT RETURN 9FT ADLT (ELECTROSURGICAL) ×3
ELECTRODE REM PT RTRN 9FT ADLT (ELECTROSURGICAL) ×1 IMPLANT
EVACUATOR 1/8 PVC DRAIN (DRAIN) ×3 IMPLANT
FACESHIELD WRAPAROUND (MASK) ×12 IMPLANT
GAUZE SPONGE 4X4 12PLY STRL (GAUZE/BANDAGES/DRESSINGS) ×3 IMPLANT
GLOVE BIO SURGEON STRL SZ7.5 (GLOVE) IMPLANT
GLOVE BIO SURGEON STRL SZ8 (GLOVE) ×3 IMPLANT
GLOVE BIOGEL PI IND STRL 8 (GLOVE) ×1 IMPLANT
GLOVE BIOGEL PI INDICATOR 8 (GLOVE) ×2
GLOVE SURG SS PI 6.5 STRL IVOR (GLOVE) IMPLANT
GOWN STRL REUS W/TWL LRG LVL3 (GOWN DISPOSABLE) ×3 IMPLANT
GOWN STRL REUS W/TWL XL LVL3 (GOWN DISPOSABLE) IMPLANT
IMMOBILIZER KNEE 20 (SOFTGOODS) ×3
IMMOBILIZER KNEE 20 THIGH 36 (SOFTGOODS) ×1 IMPLANT
KIT BASIN OR (CUSTOM PROCEDURE TRAY) ×3 IMPLANT
MANIFOLD NEPTUNE II (INSTRUMENTS) ×3 IMPLANT
NDL SAFETY ECLIPSE 18X1.5 (NEEDLE) ×2 IMPLANT
NEEDLE HYPO 18GX1.5 SHARP (NEEDLE) ×4
NS IRRIG 1000ML POUR BTL (IV SOLUTION) ×3 IMPLANT
PACK TOTAL JOINT (CUSTOM PROCEDURE TRAY) ×3 IMPLANT
PADDING CAST COTTON 6X4 STRL (CAST SUPPLIES) ×3 IMPLANT
PASSER SUT SWANSON 36MM LOOP (INSTRUMENTS) ×3 IMPLANT
POSITIONER SURGICAL ARM (MISCELLANEOUS) ×3 IMPLANT
STRIP CLOSURE SKIN 1/2X4 (GAUZE/BANDAGES/DRESSINGS) ×4 IMPLANT
SUT ETHIBOND NAB CT1 #1 30IN (SUTURE) ×6 IMPLANT
SUT MNCRL AB 4-0 PS2 18 (SUTURE) ×3 IMPLANT
SUT VIC AB 2-0 CT1 27 (SUTURE) ×6
SUT VIC AB 2-0 CT1 TAPERPNT 27 (SUTURE) ×3 IMPLANT
SUT VLOC 180 0 24IN GS25 (SUTURE) ×3 IMPLANT
SYR 20CC LL (SYRINGE) ×3 IMPLANT
SYR 50ML LL SCALE MARK (SYRINGE) ×3 IMPLANT
TOWEL OR 17X26 10 PK STRL BLUE (TOWEL DISPOSABLE) ×6 IMPLANT
TOWEL OR NON WOVEN STRL DISP B (DISPOSABLE) ×3 IMPLANT
TRAY FOLEY CATH 14FRSI W/METER (CATHETERS) IMPLANT
TRAY FOLEY CATH 16FR SILVER (SET/KITS/TRAYS/PACK) ×3 IMPLANT
WATER STERILE IRR 1500ML POUR (IV SOLUTION) ×3 IMPLANT

## 2014-08-15 NOTE — Anesthesia Postprocedure Evaluation (Signed)
  Anesthesia Post-op Note  Patient: Cody Morales  Procedure(s) Performed: Procedure(s) (LRB): RIGHT TOTAL HIP ARTHROPLASTY (Right)  Patient Location: PACU  Anesthesia Type: General  Level of Consciousness: awake and alert   Airway and Oxygen Therapy: Patient Spontanous Breathing  Post-op Pain: mild  Post-op Assessment: Post-op Vital signs reviewed, Patient's Cardiovascular Status Stable, Respiratory Function Stable, Patent Airway and No signs of Nausea or vomiting  Last Vitals:  Filed Vitals:   08/15/14 1924  BP:   Pulse:   Temp: 36.9 C  Resp:     Post-op Vital Signs: stable   Complications: No apparent anesthesia complications

## 2014-08-15 NOTE — Op Note (Signed)
Pre-operative diagnosis- Osteoarthritis Right hip  Post-operative diagnosis- Osteoarthritis  Right hip  Procedure-  RightTotal Hip Arthroplasty  Surgeon- Gus Rankin. Bryttany Tortorelli, MD  Assistant- Avel Peace, PA-C   Anesthesia  General  EBL- 700 ml   Drain Hemovac   Complication- None  Condition-PACU - hemodynamically stable.   Brief Clinical Note- Cody Morales is a 58 y.o. male with end stage arthritis of his right hip with prior history of SCFE with progressively worsening pain and dysfunction. Pain occurs with activity and rest including pain at night. He has tried analgesics, protected weight bearing and rest without benefit. Pain is too severe to attempt physical therapy. Radiographs demonstrate severe bone on bone arthritis with subchondral cyst formation and severe proximal femoral deformity. He presents now for right THA.  Procedure in detail-   The patient is brought into the operating room and placed on the operating table. After successful administration of General  anesthesia, the patient is placed in the  Left lateral decubitus position with the  Right side up and held in place with the hip positioner. The lower extremity is isolated from the perineum with plastic drapes and time-out is performed by the surgical team. The lower extremity is then prepped and draped in the usual sterile fashion. A short posterolateral incision is made with a ten blade through the subcutaneous tissue to the level of the fascia lata which is incised in line with the skin incision. The sciatic nerve is palpated and protected and the short external rotators and capsule are isolated from the femur. The hip is then dislocated and the center of the femoral head is marked. A trial prosthesis is placed such that the trial head corresponds to the center of the patients' native femoral head. The resection level is marked on the femoral neck and the resection is made with an oscillating saw. The femoral head is  removed and femoral retractors placed to gain access to the femoral canal.      The canal finder is passed into the femoral canal and the canal is thoroughly irrigated with sterile saline to remove the fatty contents. Axial reaming is performed to 15.5  mm, proximal reaming to 20 D  and the sleeve machined to a large. A 20 D large trial sleeve is placed into the proximal femur.      The femur is then retracted anteriorly to gain acetabular exposure. Acetabular retractors are placed and the labrum and osteophytes are removed, Acetabular reaming is performed to 57  mm and a 58  mm Pinnacle acetabular shell is placed in anatomic position with excellent purchase. Additional dome screws were not needed. The permanent 36 mm neutral + 4 Marathon liner is placed into the acetabular shell.      The trial femur is then placed into the femoral canal. The size is 20 x 15  stem with a 36 + 8  neck and a 36 + 0 head with the neck version 10 degrees less than  the patients' native anteversion. The hip is reduced with excellent stability with full extension and full external rotation, 70 degrees flexion with 40 degrees adduction and 90 degrees internal rotation and 90 degrees of flexion with 70 degrees of internal rotation. The operative leg is placed on top of the non-operative leg and the leg lengths are found to be equal. The trials are then removed and the permanent implant of the same size is impacted into the femoral canal. The ceramic femoral head of the same size  as the trial is placed and the hip is reduced with the same stability parameters. The operative leg is again placed on top of the non-operative leg and the leg lengths are found to be equal.      The wound is then copiously irrigated with saline solution and the capsule and short external rotators are re-attached to the femur through drill holes with Ethibond suture. The fascia lata is closed over a hemovac drain with #1 vicryl suture and the fascia lata,  gluteal muscles and subcutaneous tissues are injected with Exparel 20ml diluted with saline 30 ml plus a second injection of 20 ml of .25% Marcaine. The subcutaneous tissues are closed with #1 and2-0 vicryl and the subcuticular layer closed with running 4-0 Monocryl. The drain is hooked to suction, incision cleaned and dried, and steri-srips and a bulky sterile dressing applied. The limb is placed into a knee immobilizer and the patient is awakened and transported to recovery in stable condition.      Please note that a surgical assistant was a medical necessity for this procedure in order to perform it in a safe and expeditious manner. The assistant was necessary to provide retraction to the vital neurovascular structures and to retract and position the limb to allow for anatomic placement of the prosthetic components.  Gus RankinFrank V. Wylma Tatem, MD    08/15/2014, 7:00 PM

## 2014-08-15 NOTE — H&P (View-Only) (Signed)
Cody Morales DOB: 12/26/1955 Divorced / Language: English / Race: Black or African American, White Male Date of Admission: 08-15-2014 Chief Complaint:  Right Hip Pain History of Present Illness  The patient is a 58 year old male who comes in for a preoperative history and physical. The patient is scheduled for a right total hip arthroplasty to be performed by Dr. Frank V. Aluisio, MD at [facility] on 08/15/2014. The patient reports left hip and right hip problems including pain, stiffness and decreased range of motion symptoms that have been present for year(s). 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 He has had surgeries in the past hip pinnings and then the had hardware taken out. He was doing well but over time has started to have increased soreness and stiffness. In the last two years he has lost range of motion. He was told he needed hip replacements and was referred to Dr.Aluisio. He is currently taking hydrocodone as needed.).The patient feels as if their symptoms are does feel they are worsening. Unfortunately, he has severe problems with both hips. His right hip is currently more symptomatic and problematic than the left. He has pain in both occurring at all times. This definitely limits his mobility and limits what he can and can not do. The problems date back to his teen years when he had pinning for slipped capital femoral epiphysis. He had a subsequent hardware removal. He has had rotational osteotomies. He is at a stage now where his hips hurt him at all times. He can not do anything he desires. At this point, the only predictable means of improving his pain and function will be a total hip arthroplasty. He is not a candidate for an anterior approach on the right given the significant deformity and the essentially fused hip. He will have to have a posterior approach. We did discuss this in detail, including the  procedure, risks,potential complications and rehab course. He wants to go ahead and proceed with a right total hip arthroplasty first. He can eventually have the left hip done and that could probably be done via an anterior approach. They have been treated conservatively in the past for the above stated problem and despite conservative measures, they continue to have progressive pain and severe functional limitations and dysfunction. They have failed non-operative management including home exercise, medications, and injections. It is felt that they would benefit from undergoing total joint replacement. Risks and benefits of the procedure have been discussed with the patient and they elect to proceed with surgery. There are no active contraindications to surgery such as ongoing infection or rapidly progressive neurological disease.  Problem List/Past Medical Osteoarthritis of right hip (715.95  M16.11) History of DVT - Blood Clot Date: 1996. Treated with Coumadin High blood pressure  Allergies No Known Drug Allergies  Family History Heart Disease Maternal Grandmother. Heart disease in male family member before age 65 First Degree Relatives reported  Social History  No history of drug/alcohol rehab Not under pain contract Marital status divorced Exercise Exercises rarely; does running / walking Living situation live with partner Tobacco use Current some day smoker. 05/01/2014: smoke(d) 3 or more pack(s) per day Number of flights of stairs before winded 2-3 Tobacco / smoke exposure 05/01/2014: yes Current drinker 05/01/2014: Currently drinks wine 5-7 times per week Current work status retired Children 1  Medication History Diclofenac Sodium ER (100MG Tablet ER 24HR, Oral) Active. Hydrocodone-Acetaminophen (5-325MG Tablet, Oral) Active.   Nifedical XL (30MG  Tablet ER 24HR, Oral) Active. TraZODone HCl (50MG  Tablet, Oral daily) Active. TraMADol HCl Active.  Past  Surgical History Inguinal Hernia Repair open: bilateral Bilateral Hip Pinning for SCFE's   Review of Systems General Not Present- Chills, Fatigue, Fever, Memory Loss, Night Sweats, Weight Gain and Weight Loss. Skin Not Present- Eczema, Hives, Itching, Lesions and Rash. HEENT Not Present- Dentures, Double Vision, Headache, Hearing Loss, Tinnitus and Visual Loss. Respiratory Not Present- Allergies, Chronic Cough, Coughing up blood, Shortness of breath at rest and Shortness of breath with exertion. Cardiovascular Not Present- Chest Pain, Difficulty Breathing Lying Down, Murmur, Palpitations, Racing/skipping heartbeats and Swelling. Gastrointestinal Not Present- Abdominal Pain, Bloody Stool, Constipation, Diarrhea, Difficulty Swallowing, Heartburn, Jaundice, Loss of appetitie, Nausea and Vomiting. Male Genitourinary Not Present- Blood in Urine, Discharge, Flank Pain, Incontinence, Painful Urination, Urgency, Urinary frequency, Urinary Retention, Urinating at Night and Weak urinary stream. Musculoskeletal Present- Morning Stiffness and Muscle Pain. Not Present- Back Pain, Joint Pain, Joint Swelling, Muscle Weakness and Spasms. Neurological Not Present- Blackout spells, Difficulty with balance, Dizziness, Paralysis, Tremor and Weakness. Psychiatric Not Present- Insomnia.   Vitals Weight: 216 lb Height: 70in Body Surface Area: 2.2 m Body Mass Index: 30.99 kg/m BP: 124/92 (Sitting, Right Arm, Standard)  Physical Exam  General Mental Status -Alert, cooperative and good historian. General Appearance-pleasant, Not in acute distress. Orientation-Oriented X3. Build & Nutrition-Well nourished and Well developed. Gait-Antalgic and Limping.  Head and Neck Head-normocephalic, atraumatic . Neck Global Assessment - supple, no bruit auscultated on the right, no bruit auscultated on the left.  Eye Pupil - Bilateral-Regular and Round. Motion - Bilateral-EOMI.  Chest and  Lung Exam Auscultation Breath sounds - clear at anterior chest wall and clear at posterior chest wall. Adventitious sounds - No Adventitious sounds.  Cardiovascular Auscultation Rhythm - Regular rate and rhythm. Heart Sounds - S1 WNL and S2 WNL. Murmurs & Other Heart Sounds - Auscultation of the heart reveals - No Murmurs.  Abdomen Palpation/Percussion Tenderness - Abdomen is non-tender to palpation. Rigidity (guarding) - Abdomen is soft. Auscultation Auscultation of the abdomen reveals - Bowel sounds normal.  Male Genitourinary Note: Not done, not pertinent to present illness   Musculoskeletal Note: He is a well developed male. He is alert and oriented. No apparent distress. The right hip has flexion to about 70, zero internal or external rotation. No abduction. The left hip flexion is to about 90, about 5-10 external rotation and about 10 degrees of abduction. The knee exams are normal. Pulse, sensation and motor are intact in both lower extremities. He has a very stiff legged antalgic gait pattern.  RADIOGRAPHS: AP pelvis and lateral of both hips show severe end stage arthritic change, right worse than left hip. 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 On the left he also has severe bone on bone arthritis with subchondral cystic formation but less deformity.  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 Lequita HaltAluisio.  Plan is to go home.  PCP - Dr. Liliane ShiSuz Hilton  The patient will receive topical TXA (tranexamic acid) due to: DVT in 1996  Signed electronically by Tabathia Knoche Tessie FassL Kiyomi Pallo, III PA-C

## 2014-08-15 NOTE — Plan of Care (Signed)
Problem: Consults Goal: Diagnosis- Total Joint Replacement Outcome: Completed/Met Date Met:  08/15/14 Primary Total Hip

## 2014-08-15 NOTE — Anesthesia Preprocedure Evaluation (Addendum)
Anesthesia Evaluation  Patient identified by MRN, date of birth, ID band Patient awake    Reviewed: Allergy & Precautions, H&P , NPO status , Patient's Chart, lab work & pertinent test results  Airway Mallampati: II TM Distance: >3 FB Neck ROM: full    Dental no notable dental hx. (+) Teeth Intact, Dental Advisory Given   Pulmonary neg pulmonary ROS, Current Smoker,  breath sounds clear to auscultation  Pulmonary exam normal       Cardiovascular Exercise Tolerance: Good hypertension, Pt. on medications Rhythm:regular Rate:Normal     Neuro/Psych negative neurological ROS  negative psych ROS   GI/Hepatic negative GI ROS, Neg liver ROS, GERD-  Medicated and Controlled,  Endo/Other  negative endocrine ROSEnlarged thyroid  Renal/GU negative Renal ROS  negative genitourinary   Musculoskeletal   Abdominal   Peds  Hematology negative hematology ROS (+)   Anesthesia Other Findings   Reproductive/Obstetrics negative OB ROS                          Anesthesia Physical Anesthesia Plan  ASA: II  Anesthesia Plan: General   Post-op Pain Management:    Induction: Intravenous  Airway Management Planned: Oral ETT  Additional Equipment:   Intra-op Plan:   Post-operative Plan: Extubation in OR  Informed Consent: I have reviewed the patients History and Physical, chart, labs and discussed the procedure including the risks, benefits and alternatives for the proposed anesthesia with the patient or authorized representative who has indicated his/her understanding and acceptance.   Dental Advisory Given  Plan Discussed with: CRNA and Surgeon  Anesthesia Plan Comments:        Anesthesia Quick Evaluation

## 2014-08-15 NOTE — Interval H&P Note (Signed)
History and Physical Interval Note:  08/15/2014 4:11 PM  Cody Morales  has presented today for surgery, with the diagnosis of OA RIGHT HIP  The various methods of treatment have been discussed with the patient and family. After consideration of risks, benefits and other options for treatment, the patient has consented to  Procedure(s): RIGHT TOTAL HIP ARTHROPLASTY (Right) as a surgical intervention .  The patient's history has been reviewed, patient examined, no change in status, stable for surgery.  I have reviewed the patient's chart and labs.  Questions were answered to the patient's satisfaction.     Loanne DrillingALUISIO,Bell Carbo V

## 2014-08-15 NOTE — Transfer of Care (Signed)
Immediate Anesthesia Transfer of Care Note  Patient: Cody Morales  Procedure(s) Performed: Procedure(s): RIGHT TOTAL HIP ARTHROPLASTY (Right)  Patient Location: PACU  Anesthesia Type:General  Level of Consciousness: awake, sedated and responds to stimulation  Airway & Oxygen Therapy: Patient Spontanous Breathing and Patient connected to face mask oxygen  Post-op Assessment: Report given to PACU RN and Post -op Vital signs reviewed and stable  Post vital signs: Reviewed and stable  Complications: No apparent anesthesia complications

## 2014-08-16 ENCOUNTER — Encounter (HOSPITAL_COMMUNITY): Payer: Self-pay | Admitting: Orthopedic Surgery

## 2014-08-16 LAB — CBC
HEMATOCRIT: 35.3 % — AB (ref 39.0–52.0)
Hemoglobin: 11.9 g/dL — ABNORMAL LOW (ref 13.0–17.0)
MCH: 32.1 pg (ref 26.0–34.0)
MCHC: 33.7 g/dL (ref 30.0–36.0)
MCV: 95.1 fL (ref 78.0–100.0)
PLATELETS: 154 10*3/uL (ref 150–400)
RBC: 3.71 MIL/uL — ABNORMAL LOW (ref 4.22–5.81)
RDW: 13.6 % (ref 11.5–15.5)
WBC: 10.3 10*3/uL (ref 4.0–10.5)

## 2014-08-16 LAB — BASIC METABOLIC PANEL
ANION GAP: 12 (ref 5–15)
BUN: 10 mg/dL (ref 6–23)
CHLORIDE: 103 meq/L (ref 96–112)
CO2: 22 mEq/L (ref 19–32)
Calcium: 8.1 mg/dL — ABNORMAL LOW (ref 8.4–10.5)
Creatinine, Ser: 0.81 mg/dL (ref 0.50–1.35)
GFR calc non Af Amer: >90 mL/min (ref >90)
Glucose, Bld: 152 mg/dL — ABNORMAL HIGH (ref 70–99)
Potassium: 4.2 mEq/L (ref 3.7–5.3)
Sodium: 137 mEq/L (ref 137–147)

## 2014-08-16 NOTE — Evaluation (Addendum)
Occupational Therapy Evaluation Patient Details Name: Koury Roddy MRN: [id number] DOB: [birth year] Today's Date: 08/16/2014    History of Present Illness 58 yo male s/p R posterior THA 08/15/14. Hx of DVT, HTN, L chronic hip deficits.    Clinical Impression   Pt became dizzy during session with functional transfers with walker. Chair pulled up. Feel he will progress well however. Will continue to follow for reinforcing THPs and ADL practice for return home.    Follow Up Recommendations  Home health OT;Supervision/Assistance - 24 hour    Equipment Recommendations  3 in 1 bedside comode    Recommendations for Other Services       Precautions / Restrictions Precautions Precautions: Posterior Hip Precaution Booklet Issued: Yes (comment) Precaution Comments: Reviewed and demonstrated posterior hip precautions. Restrictions Weight Bearing Restrictions: No RLE Weight Bearing: Weight bearing as tolerated      Mobility Bed Mobility Overal bed mobility: Needs Assistance Bed Mobility: Supine to Sit     Supine to sit: HOB elevated;Min assist     General bed mobility comments: Assist for R LE. Increased time.   Transfers Overall transfer level: Needs assistance Equipment used: Rolling walker (2 wheeled) Transfers: Sit to/from Stand Sit to Stand: Moderate assist         General transfer comment: Assist to rise, stabilize, control descent. VCs safety, technique, hand placement    Balance                                            ADL Overall ADL's : Needs assistance/impaired Eating/Feeding: Independent;Sitting   Grooming: Wash/dry hands;Set up;Sitting   Upper Body Bathing: Set up;Sitting   Lower Body Bathing: Sit to/from stand;Maximal assistance Lower Body Bathing Details (indicate cue type and reason): without AE Upper Body Dressing : Set up;Sitting   Lower Body Dressing: Maximal assistance;Sit to/from stand Lower Body Dressing Details  (indicate cue type and reason): without AE Toilet Transfer: moderate assistance;Ambulation;RW   Toileting- Clothing Manipulation and Hygiene: Moderate assistance;Sit to/from stand         General ADL Comments: Reviewed all hip precautions with pt. 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 He became dizzy after functional mobility around the bed with walker, so chair pulled up. BP 92/59 once in chair. Will follow for AE education, 3in1 practice and shower transfer if pt ready.      Vision                     Perception     Praxis      Pertinent Vitals/Pain Pain Assessment: 0-10 Pain Score: 5  Pain Location: R hip Pain Descriptors / Indicators: Aching Pain Intervention(s): Premedicated before session;Monitored during session;Ice applied     Hand Dominance     Extremity/Trunk Assessment Upper Extremity Assessment Upper Extremity Assessment: Overall WFL for tasks assessed      Cervical / Trunk Assessment Cervical / Trunk Assessment: Normal   Communication Communication Communication: No difficulties   Cognition Arousal/Alertness: Awake/alert Behavior During Therapy: WFL for tasks assessed/performed Overall Cognitive Status: Within Functional Limits for tasks assessed                     General Comments       Exercises       Shoulder Instructions      Home Living Family/patient expects  to be discharged to:: Private residence Living Arrangements: Spouse/significant other;Parent Available Help at Discharge: Family Type of Home: House Home Access: Stairs to enter Secretary/administratorntrance Stairs-Number of Steps: 1   Home Layout: Two level Alternate Level Stairs-Number of Steps: 16 Alternate Level Stairs-Rails: Can reach both;Right;Left Bathroom Shower/Tub: Producer, television/film/videoWalk-in shower   Bathroom Toilet: Standard     Home Equipment: Cane - single point;Toilet riser          Prior Functioning/Environment Level of Independence: Independent              OT Diagnosis: Generalized weakness   OT Problem List: Decreased strength;Decreased knowledge of use of DME or AE;Decreased activity tolerance   OT Treatment/Interventions: Self-care/ADL training;Patient/family education;Therapeutic activities;DME and/or AE instruction    OT Goals(Current goals can be found in the care plan section) Acute Rehab OT Goals Patient Stated Goal: home tomorrow.  OT Goal Formulation: With patient Time For Goal Achievement: 08/23/14 Potential to Achieve Goals: Good  OT Frequency: Min 2X/week   Barriers to D/C:            Co-evaluation PT/OT/SLP Co-Evaluation/Treatment: Yes Reason for Co-Treatment: For patient/therapist safety   OT goals addressed during session: ADL's and self-care;Proper use of Adaptive equipment and DME      End of Session Equipment Utilized During Treatment: Gait belt;Rolling walker  Activity Tolerance: Other (comment) (dizziness) Patient left: in chair;with call bell/phone within reach   Time: 1030-1103 OT Time Calculation (min): 33 min Charges:  OT General Charges $OT Visit: 1 Procedure OT Evaluation $Initial OT Evaluation Tier I: 1 Procedure OT Treatments $Therapeutic Activity: 8-22 mins G-Codes:    Lennox LaityStone, Faduma Cho Stafford [phone] 08/16/2014, 1:04 PM

## 2014-08-16 NOTE — Progress Notes (Signed)
Utilization review completed.  

## 2014-08-16 NOTE — Progress Notes (Signed)
Physical Therapy Treatment Patient Details Name: Cody Morales MRN: [id number] DOB: Apr 14, 1956 Today's Date: 08/16/2014    History of Present Illness 58 yo male s/p R posterior THA 08/15/14. Hx of DVT, HTN, L chronic hip deficits.     PT Comments    Progressing with mobility. Increased distance this session-pt tolerated well. 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 Asked pt to have family bring shoes up to try one shoe on L foot to see if this feels better for him. Possible d/c home on tomorrow. Needs to practice flight of steps (bedroom upstairs).   Follow Up Recommendations  Home health PT;Supervision/Assistance - 24 hour     Equipment Recommendations  Rolling walker with 5" wheels    Recommendations for Other Services OT consult     Precautions / Restrictions Precautions Precautions: Posterior Hip Precaution Booklet Issued: Yes (comment) Precaution Comments: Reviewed and demonstrated posterior hip precautions. Restrictions Weight Bearing Restrictions: No RLE Weight Bearing: Weight bearing as tolerated    Mobility  Bed Mobility Overal bed mobility: Needs Assistance Bed Mobility: Sit to Supine     Supine to sit: HOB elevated;Min assist Sit to supine: Min assist   General bed mobility comments: Assist for R LE. Increased time.   Transfers Overall transfer level: Needs assistance Equipment used: Rolling walker (2 wheeled) Transfers: Sit to/from Stand Sit to Stand: Mod assist         General transfer comment: Assist to rise, stabilize, control descent. VCs safety, technique, hand placement  Ambulation/Gait Ambulation/Gait assistance: Min guard Ambulation Distance (Feet): 75 Feet Assistive device: Rolling walker (2 wheeled) Gait Pattern/deviations: Step-to pattern;Step-through pattern;Decreased stride length;Trunk flexed     General Gait Details: slow gait speed. pt tolerated well. no c/o  dizziness this session.    Stairs            Wheelchair Mobility    Modified Rankin (Stroke Patients Only)       Balance                                    Cognition Arousal/Alertness: Awake/alert Behavior During Therapy: WFL for tasks assessed/performed Overall Cognitive Status: Within Functional Limits for tasks assessed                      Exercises Total Joint Exercises Ankle Circles/Pumps: AROM;Both;15 reps;Supine Quad Sets: AROM;Both;15 reps;Supine Heel Slides: AAROM;Right;15 reps;Supine Hip ABduction/ADduction: AAROM;Right;15 reps;Supine (limited ROM)    General Comments        Pertinent Vitals/Pain Pain Assessment: 0-10 Pain Score: 5  Pain Location: R hip Pain Descriptors / Indicators: Sore Pain Intervention(s): Premedicated before session;Monitored during session;Ice applied    Home Living Family/patient expects to be discharged to:: Private residence Living Arrangements: Spouse/significant other;Parent Available Help at Discharge: Family Type of Home: House Home Access: Stairs to enter   Home Layout: Two level Home Equipment: Cane - single point;Toilet riser      Prior Function Level of Independence: Independent          PT Goals (current goals can now be found in the care plan section) Acute Rehab PT Goals Patient Stated Goal: home tomorrow.  PT Goal Formulation: With patient Time For Goal Achievement: 08/23/14 Potential to Achieve Goals: Good Progress towards PT goals: Progressing toward goals    Frequency  7X/week    PT Plan Current plan remains  appropriate    Co-evaluation   Reason for Co-Treatment: For patient/therapist safety   OT goals addressed during session: ADL's and self-care;Proper use of Adaptive equipment and DME     End of Session Equipment Utilized During Treatment: Gait belt Activity Tolerance: Patient tolerated treatment well Patient left: in bed;with call bell/phone within reach      Time: 1345-1434 PT Time Calculation (min): 49 min  Charges:  $Gait Training: 23-37 mins $Therapeutic Exercise: 8-22 mins                    G Codes:      Rebeca Alert, MPT Pager: [phone]

## 2014-08-16 NOTE — Discharge Instructions (Addendum)
°Dr. Frank Aluisio °Total Joint Specialist °[facility] °[address] °[place], [place] [address] °[phone] ° ° °TOTAL HIP REPLACEMENT POSTOPERATIVE DIRECTIONS ° ° ° °Hip Rehabilitation, Guidelines Following Surgery  °The results of a hip operation are greatly improved after range of motion and muscle strengthening exercises. Follow all safety measures which are given to protect your hip. If any of these exercises cause increased pain or swelling in your joint, decrease the amount until you are comfortable again. Then slowly increase the exercises. Call your caregiver if you have problems or questions.  °HOME CARE INSTRUCTIONS  °Most of the following instructions are designed to prevent the dislocation of your new hip.  °Remove items at home which could result in a fall. This includes throw rugs or furniture in walking pathways.  °Continue medications as instructed at time of discharge. °· You may have some home medications which will be placed on hold until you complete the course of blood thinner medication. °· You may start showering once you are discharged home but do not submerge the incision under water. Just pat the incision dry and apply a dry gauze dressing on daily. °Do not put on socks or shoes without following the instructions of your caregivers.  °Sit on high chairs so your hips are not bent more than 90 degrees.  °Sit on chairs with arms. Use the chair arms to help push yourself up when arising.  °Keep your leg on the side of the operation out in front of you when standing up.  °Arrange for the use of a toilet seat elevator so you are not sitting low.  °Do not do any exercises or get in any positions that cause your toes to point in (pigeon toed).  °Always sleep with a pillow between your legs. Do not lie on your side in sleep with both knees touching the bed.  °· Walk with walker as instructed.  °You may resume a sexual relationship in one month or when given the OK by  your caregiver.  °Use walker as long as suggested by your caregivers.  °You may put full weight on your legs and walk as much as is comfortable. °Avoid periods of inactivity such as sitting longer than an hour when not asleep. This helps prevent blood clots.  °You may return to work once you are cleared by your surgeon.  °Do not drive a car for 6 weeks or until released by your surgeon.  °Do not drive while taking narcotics.  °Wear elastic stockings for three weeks following surgery during the day but you may remove then at night.  °Make sure you keep all of your appointments after your operation with all of your doctors and caregivers. You should call the office at the above phone number and make an appointment for approximately two weeks after the date of your surgery. °Change the dressing daily and reapply a dry dressing each time. °Please pick up a stool softener and laxative for home use as long as you are requiring pain medications. °· Continue to use ice on the hip for pain and swelling from surgery. You may notice swelling that will progress down to the foot and ankle.  This is normal after  surgery.  Elevate the leg when you are not up walking on it.   °It is important for you to complete the blood thinner medication as prescribed by your doctor. °· Continue to use the breathing machine which will help keep your temperature down.  It   is common for your temperature to cycle up and down following surgery, especially at night when you are not up moving around and exerting yourself.  The breathing machine keeps your lungs expanded and your temperature down. ° °RANGE OF MOTION AND STRENGTHENING EXERCISES  °These exercises are designed to help you keep full movement of your hip joint. Follow your caregiver's or physical therapist's instructions. Perform all exercises about fifteen times, three times per day or as directed. Exercise both hips, even if you have had only one joint replacement. These exercises can be  done on a training (exercise) mat, on the floor, on a table or on a bed. Use whatever works the best and is most comfortable for you. Use music or television while you are exercising so that the exercises are a pleasant break in your day. This will make your life better with the exercises acting as a break in routine you can look forward to.  °Lying on your back, slowly slide your foot toward your buttocks, raising your knee up off the floor. Then slowly slide your foot back down until your leg is straight again.  °Lying on your back spread your legs as far apart as you can without causing discomfort.  °Lying on your side, raise your upper leg and foot straight up from the floor as far as is comfortable. Slowly lower the leg and repeat.  °Lying on your back, tighten up the muscle in the front of your thigh (quadriceps muscles). You can do this by keeping your leg straight and trying to raise your heel off the floor. This helps strengthen the largest muscle supporting your knee.  °Lying on your back, tighten up the muscles of your buttocks both with the legs straight and with the knee bent at a comfortable angle while keeping your heel on the floor.  ° °SKILLED REHAB INSTRUCTIONS: °If the patient is transferred to a skilled rehab facility following release from the hospital, a list of the current medications will be sent to the facility for the patient to continue.  When discharged from the skilled rehab facility, please have the facility set up the patient's Home Health Physical Therapy prior to being released. Also, the skilled facility will be responsible for providing the patient with their medications at time of release from the facility to include their pain medication, the muscle relaxants, and their blood thinner medication. If the patient is still at the rehab facility at time of the two week follow up appointment, the skilled rehab facility will also need to assist the patient in arranging follow up  appointment in our office and any transportation needs. ° °MAKE SURE YOU:  °Understand these instructions.  °Will watch your condition.  °Will get help right away if you are not doing well or get worse. ° °Pick up stool softner and laxative for home. °Do not submerge incision under water. °May shower. °Continue to use ice for pain and swelling from surgery. °Hip precautions.  °Total Hip Protocol. ° °Take Xarelto for two and a half more weeks, then discontinue Xarelto. °Once the patient has completed the blood thinner regimen, then take a Baby 81 mg Aspirin daily for three more weeks. ° ° °Information on my medicine - XARELTO® (Rivaroxaban) ° °This medication education was reviewed with me or my healthcare representative as part of my discharge preparation.  The pharmacist that spoke with me during my hospital stay was:  Jackson, Rachel E, RPH ° °Why was Xarelto® prescribed for you? °Xarelto® was   prescribed for you to reduce the risk of blood clots forming after orthopedic surgery. The medical term for these abnormal blood clots is venous thromboembolism (VTE). ° °What do you need to know about xarelto® ? °Take your Xarelto® ONCE DAILY at the same time every day. °You may take it either with or without food. ° °If you have difficulty swallowing the tablet whole, you may crush it and mix in applesauce just prior to taking your dose. ° °Take Xarelto® exactly as prescribed by your doctor and DO NOT stop taking Xarelto® without talking to the doctor who prescribed the medication.  Stopping without other VTE prevention medication to take the place of Xarelto® may increase your risk of developing a clot. ° °After discharge, you should have regular check-up appointments with your healthcare provider that is prescribing your Xarelto®.   ° °What do you do if you miss a dose? °If you miss a dose, take it as soon as you remember on the same day then continue your regularly scheduled once daily regimen the next day. Do not take  two doses of Xarelto® on the same day.  ° °Important Safety Information °A possible side effect of Xarelto® is bleeding. You should call your healthcare provider right away if you experience any of the following: °  Bleeding from an injury or your nose that does not stop. °  Unusual colored urine (red or dark brown) or unusual colored stools (red or black). °  Unusual bruising for unknown reasons. °  A serious fall or if you hit your head (even if there is no bleeding). ° °Some medicines may interact with Xarelto® and might increase your risk of bleeding while on Xarelto®. To help avoid this, consult your healthcare provider or pharmacist prior to using any new prescription or non-prescription medications, including herbals, vitamins, non-steroidal anti-inflammatory drugs (NSAIDs) and supplements. ° °This website has more information on Xarelto®: www.xarelto.com. ° ° °

## 2014-08-16 NOTE — Evaluation (Signed)
Physical Therapy Evaluation Patient Details Name: Cody Morales MRN: [id number] DOB: 26-Feb-1956 Today's Date: 08/16/2014   History of Present Illness  58 yo male s/p R posterior THA 08/15/14. Hx of DVT, HTN, L chronic hip deficits.   Clinical Impression  On eval, pt required Mod assist for mobility-able to ambulate ~7 feet with rolling walker. Distance limited by pt c/o dizziness-BP 92/59. Deferred further mobility and assisted pt to recliner.     Follow Up Recommendations Home health PT;Supervision/Assistance - 24 hour    Equipment Recommendations  Rolling walker with 5" wheels    Recommendations for Other Services OT consult     Precautions / Restrictions Precautions Precautions: Posterior Hip Precaution Booklet Issued: Yes (comment) Precaution Comments: Reviewed and demonstrated posterior hip precautions. Restrictions Weight Bearing Restrictions: No RLE Weight Bearing: Weight bearing as tolerated      Mobility  Bed Mobility Overal bed mobility: Needs Assistance Bed Mobility: Supine to Sit     Supine to sit: HOB elevated;Min assist     General bed mobility comments: Assist for R LE. Increased time.   Transfers Overall transfer level: Needs assistance Equipment used: Rolling walker (2 wheeled) Transfers: Sit to/from Stand Sit to Stand: Min assist         General transfer comment: Assist to rise, stabilize, control descent. VCs safety, technique, hand placement  Ambulation/Gait Ambulation/Gait assistance: Min assist Ambulation Distance (Feet): 7 Feet Assistive device: Rolling walker (2 wheeled) Gait Pattern/deviations: Step-to pattern;Decreased stride length;Antalgic     General Gait Details: Distance limited by pt's report of dizziness-BP 92/59 once back in recliner. VCS safety, technique, sequence. slow gait speed.   Stairs            Wheelchair Mobility    Modified Rankin (Stroke Patients Only)       Balance                                              Pertinent Vitals/Pain Pain Assessment: 0-10 Pain Score: 5  Pain Location: R hip Pain Intervention(s): Premedicated before session;Monitored during session;Ice applied    Home Living Family/patient expects to be discharged to:: Private residence Living Arrangements: Spouse/significant other;Parent Available Help at Discharge: Family Type of Home: House Home Access: Stairs to enter   Secretary/administrator of Steps: 1 Home Layout: Two level Home Equipment: Cane - single point;Bedside commode      Prior Function Level of Independence: Independent               Hand Dominance        Extremity/Trunk Assessment   Upper Extremity Assessment: Overall WFL for tasks assessed           Lower Extremity Assessment: LLE deficits/detail;RLE deficits/detail RLE Deficits / Details: hip add/abd 2/5, moves ankle well LLE Deficits / Details: Limited ROM chronically  Cervical / Trunk Assessment: Normal  Communication   Communication: No difficulties  Cognition Arousal/Alertness: Awake/alert Behavior During Therapy: WFL for tasks assessed/performed Overall Cognitive Status: Within Functional Limits for tasks assessed                      General Comments      Exercises        Assessment/Plan    PT Assessment Patient needs continued PT services  PT Diagnosis Difficulty walking;Acute pain;Abnormality of gait   PT Problem List Decreased strength;Decreased range  of motion;Decreased activity tolerance;Decreased balance;Decreased mobility;Pain;Decreased knowledge of use of DME;Decreased knowledge of precautions  PT Treatment Interventions DME instruction;Gait training;Stair training;Functional mobility training;Therapeutic activities;Patient/family education;Balance training;Therapeutic exercise   PT Goals (Current goals can be found in the Care Plan section) Acute Rehab PT Goals Patient Stated Goal: home tomorrow.  PT Goal  Formulation: With patient Time For Goal Achievement: 08/23/14 Potential to Achieve Goals: Good    Frequency 7X/week   Barriers to discharge        Co-evaluation               End of Session Equipment Utilized During Treatment: Gait belt Activity Tolerance: Patient limited by fatigue (limited by dizziness) Patient left: in chair;with call bell/phone within reach           Time: 1030-1103 PT Time Calculation (min): 33 min   Charges:   PT Evaluation $Initial PT Evaluation Tier I: 1 Procedure PT Treatments $Gait Training: 8-22 mins   PT G Codes:          Rebeca AlertJannie Westlyn Glaza, MPT Pager: [phone]

## 2014-08-16 NOTE — Progress Notes (Signed)
   Subjective: 1 Day Post-Op Procedure(s) (LRB): RIGHT TOTAL HIP ARTHROPLASTY (Right) Patient reports pain as mild and moderate.   Patient seen in rounds with Dr. Lequita HaltAluisio.  Briefly discussed the surgery and the extensive arthritis found at time of surgery. Patient is well, but has had some minor complaints of pain in the hip, requiring pain medications.  Did have some slight nausea yesterday following surgery.  None at this time. We will start therapy today.  Plan is to go Home after hospital stay.  Possibly home tomorrow if does well with therapy.  Objective: Vital signs in last 24 hours: Temp:  [97.3 F (36.3 C)-100 F (37.8 C)] 99.9 F (37.7 C) (10/01 0515) Pulse Rate:  [91-109] 102 (10/01 0515) Resp:  [8-20] 20 (10/01 0515) BP: (114-140)/(72-91) 120/72 mmHg (10/01 0515) SpO2:  [95 %-100 %] 98 % (10/01 0515) Weight:  [97.977 kg (216 lb)] 97.977 kg (216 lb) (09/30 1438)  Intake/Output from previous day:  Intake/Output Summary (Last 24 hours) at 08/16/14 0725 Last data filed at 08/16/14 0528  Gross per 24 hour  Intake 4197.55 ml  Output   2110 ml  Net 2087.55 ml    Intake/Output this shift: UOP 925 since MN  Labs:  Recent Labs  08/16/14 0427  HGB 11.9*    Recent Labs  08/16/14 0427  WBC 10.3  RBC 3.71*  HCT 35.3*  PLT 154    Recent Labs  08/16/14 0427  NA 137  K 4.2  CL 103  CO2 22  BUN 10  CREATININE 0.81  GLUCOSE 152*  CALCIUM 8.1*   No results found for this basename: LABPT, INR,  in the last 72 hours  EXAM General - Patient is Alert, Appropriate and Oriented Extremity - Neurovascular intact Sensation intact distally Dorsiflexion/Plantar flexion intact Dressing - dressing C/D/I Motor Function - intact, moving foot and toes well on exam.  Hemovac with continued output.  Drain left in place for now.  Plan to pull tomorrow.  Past Medical History  Diagnosis Date  . Hypertension   . Enlarged thyroid   . GERD (gastroesophageal reflux  disease)   . Arthritis     Assessment/Plan: 1 Day Post-Op Procedure(s) (LRB): RIGHT TOTAL HIP ARTHROPLASTY (Right) Principal Problem:   OA (osteoarthritis) of hip  Estimated body mass index is 30.14 kg/(m^2) as calculated from the following:   Height as of this encounter: 5\' 11"  (1.803 m).   Weight as of this encounter: 97.977 kg (216 lb). Advance diet Up with therapy Discharge home with home health  DVT Prophylaxis - Xarelto Weight Bearing As Tolerated right Leg D/C Knee Immobilizer Hemovac Pulled Begin Therapy Hip Preacutions  Avel Peacerew Clemente Dewey, PA-C Orthopaedic Surgery 08/16/2014, 7:25 AM

## 2014-08-16 NOTE — Care Management Note (Signed)
    Page 1 of 2   08/16/2014     11:52:07 AM CARE MANAGEMENT NOTE 08/16/2014  Patient:  Cody Morales, Cody Morales   Account Number:  [id number]  Date Initiated:  08/16/2014  Documentation initiated by:  [facility]  Subjective/Objective Assessment:   adm: RIGHT TOTAL HIP ARTHROPLASTY (Right)     Action/Plan:   discharge planning   Anticipated DC Date:  08/17/2014   Anticipated DC Plan:  [place]  CM consult      [facility] Choice  HOME HEALTH   Choice offered to / List presented to:  C-1 Patient   DME arranged  3-N-1  [place]      DME agency  [place] arranged  HH-2 PT      [place].   Status of service:  Completed, signed off Medicare Important Message given?   (If response is "NO", the following Medicare IM given date fields will be blank) Date Medicare IM given:   Medicare IM given by:   Date Additional Medicare IM given:   Additional Medicare IM given by:    Discharge Disposition:  [place]  Per UR Regulation:    If discussed at Long Length of Stay Meetings, dates discussed:    Comments:  08/17/14 11:45 Cm met with pt in room to offer choice of home health agency.  Pt chooses [facility] to render HHPT.  Pt will have 3n1 and rolling walker delivered to room prior to dishcarge by [employer].  Address and contact information verified with pt.  referral called to [facility] rep, Kristen.  CM called [facility] and tenisha requested I fax orders, facesheet, and F2F to [phone].  CM faxed requested material.  No other CM needs were communicated. Mariane Masters, BSN, CM [phone].

## 2014-08-17 ENCOUNTER — Inpatient Hospital Stay (HOSPITAL_COMMUNITY): Payer: Medicare HMO

## 2014-08-17 LAB — CBC
HEMATOCRIT: 30.4 % — AB (ref 39.0–52.0)
Hemoglobin: 10.3 g/dL — ABNORMAL LOW (ref 13.0–17.0)
MCH: 32.4 pg (ref 26.0–34.0)
MCHC: 33.9 g/dL (ref 30.0–36.0)
MCV: 95.6 fL (ref 78.0–100.0)
PLATELETS: 121 10*3/uL — AB (ref 150–400)
RBC: 3.18 MIL/uL — ABNORMAL LOW (ref 4.22–5.81)
RDW: 13.7 % (ref 11.5–15.5)
WBC: 9.7 10*3/uL (ref 4.0–10.5)

## 2014-08-17 LAB — BASIC METABOLIC PANEL
Anion gap: 11 (ref 5–15)
BUN: 10 mg/dL (ref 6–23)
CALCIUM: 8 mg/dL — AB (ref 8.4–10.5)
CO2: 25 mEq/L (ref 19–32)
CREATININE: 0.86 mg/dL (ref 0.50–1.35)
Chloride: 104 mEq/L (ref 96–112)
GFR calc non Af Amer: >90 mL/min (ref >90)
Glucose, Bld: 113 mg/dL — ABNORMAL HIGH (ref 70–99)
Potassium: 3.6 mEq/L — ABNORMAL LOW (ref 3.7–5.3)
Sodium: 140 mEq/L (ref 137–147)

## 2014-08-17 MED ORDER — RIVAROXABAN 10 MG PO TABS: 10.0000 mg | ORAL_TABLET | Freq: Every day | ORAL | Status: DC

## 2014-08-17 MED ORDER — TRAMADOL HCL 50 MG PO TABS: 50.0000 mg | ORAL_TABLET | Freq: Four times a day (QID) | ORAL | Status: DC | PRN

## 2014-08-17 MED ORDER — OXYCODONE HCL 5 MG PO TABS: 5.0000 mg | ORAL_TABLET | ORAL | Status: DC | PRN

## 2014-08-17 MED ORDER — METHOCARBAMOL 500 MG PO TABS: 500.0000 mg | ORAL_TABLET | Freq: Four times a day (QID) | ORAL | Status: DC | PRN

## 2014-08-17 NOTE — Discharge Summary (Signed)
Physician Discharge Summary   Patient ID: Cody Morales MRN: [id number] DOB/AGE: 23-Apr-1956 58 y.o.  Admit date: 08/15/2014 Discharge date: 08-17-2014  Primary Diagnosis:  Osteoarthritis Right hip  Admission Diagnoses:  Past Medical History  Diagnosis Date  . Hypertension   . Enlarged thyroid   . GERD (gastroesophageal reflux disease)   . Arthritis    Discharge Diagnoses:   Principal Problem:   OA (osteoarthritis) of hip  Estimated body mass index is 30.14 kg/(m^2) as calculated from the following:   Height as of this encounter: _0  (1.803 m).   Weight as of this encounter: 97.977 kg (216 lb).  Procedure(s) (LRB): RIGHT TOTAL HIP ARTHROPLASTY (Right)   Consults: None  HPI: Cody Morales is a 58 y.o. male with end stage arthritis of his right hip with prior history of SCFE with progressively worsening pain and dysfunction. Pain occurs with activity and rest including pain at night. He has tried analgesics, protected weight bearing and rest without benefit. Pain is too severe to attempt physical therapy. Radiographs demonstrate severe bone on bone arthritis with subchondral cyst formation and severe proximal femoral deformity. He presents now for right THA.  Laboratory Data: Admission on 08/15/2014  Component Date Value Ref Range Status  . WBC 08/16/2014 10.3  4.0 - 10.5 K/uL Final  . RBC 08/16/2014 3.71* 4.22 - 5.81 MIL/uL Final  . Hemoglobin 08/16/2014 11.9* 13.0 - 17.0 g/dL Final  . HCT 08/16/2014 35.3* 39.0 - 52.0 % Final  . MCV 08/16/2014 95.1  78.0 - 100.0 fL Final  . MCH 08/16/2014 32.1  26.0 - 34.0 pg Final  . MCHC 08/16/2014 33.7  30.0 - 36.0 g/dL Final  . RDW 08/16/2014 13.6  11.5 - 15.5 % Final  . Platelets 08/16/2014 154  150 - 400 K/uL Final  . Sodium 08/16/2014 137  137 - 147 mEq/L Final  . Potassium 08/16/2014 4.2  3.7 - 5.3 mEq/L Final  . Chloride 08/16/2014 103  96 - 112 mEq/L Final  . CO2 08/16/2014 22  19 - 32 mEq/L Final  . Glucose, Bld  08/16/2014 152* 70 - 99 mg/dL Final  . BUN 08/16/2014 10  6 - 23 mg/dL Final  . Creatinine, Ser 08/16/2014 0.81  0.50 - 1.35 mg/dL Final  . Calcium 08/16/2014 8.1* 8.4 - 10.5 mg/dL Final  . GFR calc non Af Amer 08/16/2014 >90  >90 mL/min Final  . GFR calc Af Amer 08/16/2014 >90  >90 mL/min Final   Comment: (NOTE)                          The eGFR has been calculated using the CKD EPI equation.                          This calculation has not been validated in all clinical situations.                          eGFR's persistently <90 mL/min signify possible Chronic Kidney                          Disease.  . Anion gap 08/16/2014 12  5 - 15 Final  . WBC 08/17/2014 9.7  4.0 - 10.5 K/uL Final  . RBC 08/17/2014 3.18* 4.22 - 5.81 MIL/uL Final  . Hemoglobin 08/17/2014 10.3* 13.0 - 17.0 g/dL Final  . HCT  08/17/2014 30.4* 39.0 - 52.0 % Final  . MCV 08/17/2014 95.6  78.0 - 100.0 fL Final  . MCH 08/17/2014 32.4  26.0 - 34.0 pg Final  . MCHC 08/17/2014 33.9  30.0 - 36.0 g/dL Final  . RDW 08/17/2014 13.7  11.5 - 15.5 % Final  . Platelets 08/17/2014 121* 150 - 400 K/uL Final  . Sodium 08/17/2014 140  137 - 147 mEq/L Final  . Potassium 08/17/2014 3.6* 3.7 - 5.3 mEq/L Final  . Chloride 08/17/2014 104  96 - 112 mEq/L Final  . CO2 08/17/2014 25  19 - 32 mEq/L Final  . Glucose, Bld 08/17/2014 113* 70 - 99 mg/dL Final  . BUN 08/17/2014 10  6 - 23 mg/dL Final  . Creatinine, Ser 08/17/2014 0.86  0.50 - 1.35 mg/dL Final  . Calcium 08/17/2014 8.0* 8.4 - 10.5 mg/dL Final  . GFR calc non Af Amer 08/17/2014 >90  >90 mL/min Final  . GFR calc Af Amer 08/17/2014 >90  >90 mL/min Final   Comment: (NOTE)                          The eGFR has been calculated using the CKD EPI equation.                          This calculation has not been validated in all clinical situations.                          eGFR's persistently <90 mL/min signify possible Chronic Kidney                          Disease.  Georgiann Hahn gap  08/17/2014 11  5 - 15 Final  Hospital Outpatient Visit on 08/09/2014  Component Date Value Ref Range Status  . aPTT 08/09/2014 26  24 - 37 seconds Final  . WBC 08/09/2014 6.8  4.0 - 10.5 K/uL Final  . RBC 08/09/2014 4.63  4.22 - 5.81 MIL/uL Final  . Hemoglobin 08/09/2014 14.9  13.0 - 17.0 g/dL Final  . HCT 08/09/2014 43.3  39.0 - 52.0 % Final  . MCV 08/09/2014 93.5  78.0 - 100.0 fL Final  . MCH 08/09/2014 32.2  26.0 - 34.0 pg Final  . MCHC 08/09/2014 34.4  30.0 - 36.0 g/dL Final  . RDW 08/09/2014 13.7  11.5 - 15.5 % Final  . Platelets 08/09/2014 170  150 - 400 K/uL Final  . Sodium 08/09/2014 138  137 - 147 mEq/L Final  . Potassium 08/09/2014 4.1  3.7 - 5.3 mEq/L Final  . Chloride 08/09/2014 102  96 - 112 mEq/L Final  . CO2 08/09/2014 23  19 - 32 mEq/L Final  . Glucose, Bld 08/09/2014 101* 70 - 99 mg/dL Final  . BUN 08/09/2014 15  6 - 23 mg/dL Final  . Creatinine, Ser 08/09/2014 0.85  0.50 - 1.35 mg/dL Final  . Calcium 08/09/2014 8.9  8.4 - 10.5 mg/dL Final  . Total Protein 08/09/2014 7.3  6.0 - 8.3 g/dL Final  . Albumin 08/09/2014 3.7  3.5 - 5.2 g/dL Final  . AST 08/09/2014 19  0 - 37 U/L Final   Comment: SLIGHT HEMOLYSIS                          HEMOLYSIS AT THIS LEVEL MAY AFFECT  RESULT  . ALT 08/09/2014 16  0 - 53 U/L Final  . Alkaline Phosphatase 08/09/2014 72  39 - 117 U/L Final  . Total Bilirubin 08/09/2014 0.3  0.3 - 1.2 mg/dL Final  . GFR calc non Af Amer 08/09/2014 >90  >90 mL/min Final  . GFR calc Af Amer 08/09/2014 >90  >90 mL/min Final   Comment: (NOTE)                          The eGFR has been calculated using the CKD EPI equation.                          This calculation has not been validated in all clinical situations.                          eGFR's persistently <90 mL/min signify possible Chronic Kidney                          Disease.  . Anion gap 08/09/2014 13  5 - 15 Final  . Prothrombin Time 08/09/2014 13.3  11.6 - 15.2 seconds Final  . INR 08/09/2014  1.01  0.00 - 1.49 Final  . ABO/RH(D) 08/09/2014 O POS   Final  . Antibody Screen 08/09/2014 NEG   Final  . Sample Expiration 08/09/2014 08/18/2014   Final  . Color, Urine 08/09/2014 AMBER* YELLOW Final   BIOCHEMICALS MAY BE AFFECTED BY COLOR  . APPearance 08/09/2014 CLEAR  CLEAR Final  . Specific Gravity, Urine 08/09/2014 1.034* 1.005 - 1.030 Final  . pH 08/09/2014 5.5  5.0 - 8.0 Final  . Glucose, UA 08/09/2014 NEGATIVE  NEGATIVE mg/dL Final  . Hgb urine dipstick 08/09/2014 NEGATIVE  NEGATIVE Final  . Bilirubin Urine 08/09/2014 SMALL* NEGATIVE Final  . Ketones, ur 08/09/2014 NEGATIVE  NEGATIVE mg/dL Final  . Protein, ur 08/09/2014 NEGATIVE  NEGATIVE mg/dL Final  . Urobilinogen, UA 08/09/2014 0.2  0.0 - 1.0 mg/dL Final  . Nitrite 08/09/2014 NEGATIVE  NEGATIVE Final  . Leukocytes, UA 08/09/2014 NEGATIVE  NEGATIVE Final   MICROSCOPIC NOT DONE ON URINES WITH NEGATIVE PROTEIN, BLOOD, LEUKOCYTES, NITRITE, OR GLUCOSE <1000 mg/dL.  Marland Kitchen MRSA, PCR 08/09/2014 NEGATIVE  NEGATIVE Final  . Staphylococcus aureus 08/09/2014 POSITIVE* NEGATIVE Final   Comment:                                 The Xpert SA Assay (FDA                          approved for NASAL specimens                          in patients over 74 years of age),                          is one component of                          a comprehensive surveillance                          program.  Test performance has  been validated by [facility] for patients greater                          than or equal to 80 year old.                          It is not intended                          to diagnose infection nor to                          guide or monitor treatment.  . ABO/RH(D) 08/09/2014 O POS   Final     X-Rays:Dg Chest 2 View  08/09/2014   CLINICAL DATA:  Hypertension.  EXAM: CHEST  2 VIEW  COMPARISON:  None.  FINDINGS: Mediastinum and hilar structures are normal the lungs are  clear. Heart size normal. No pleural effusion or pneumothorax .  IMPRESSION: No active cardiopulmonary disease.   Electronically Signed   By: Marcello Moores  Register   On: 08/09/2014 09:26   Dg Hip Complete Right  08/09/2014   CLINICAL DATA:  Preop, right hip osteoarthritis  EXAM: RIGHT HIP - COMPLETE 2+ VIEW  COMPARISON:  None.  FINDINGS: Three views of the right hip submitted. No acute fracture. There is significant osteoarthritis bilateral hip joints. There is loss of joint space bilaterally. Significant remodeling bilateral femoral head. Sclerotic changes are noted bilateral acetabulum and bilateral femoral head.  IMPRESSION: No acute fracture. Significant degenerative changes bilateral hip joints as described above.   Electronically Signed   By: Lahoma Crocker M.D.   On: 08/09/2014 09:26   Dg Pelvis Portable  08/15/2014   CLINICAL DATA:  Postop right hip surgery.  EXAM: PORTABLE PELVIS 1-2 VIEWS  COMPARISON:  08/09/2014.  FINDINGS: The femoral and acetabular components are well seated. No complicating features are demonstrated. Stable severe degenerative changes involving the left hip.  IMPRESSION: Well seated components of a total right hip arthroplasty without complicating features.   Electronically Signed   By: Kalman Jewels M.D.   On: 08/15/2014 20:18    EKG:No orders found for this or any previous visit.   Hospital Course: Patient was admitted to [facility] and taken to the OR and underwent the above state procedure without complications.  Patient tolerated the procedure well and was later transferred to the recovery room and then to the orthopaedic floor for postoperative care.  They were given PO and IV analgesics for pain control following their surgery.  They were given 24 hours of postoperative antibiotics of  Anti-infectives   Start     Dose/Rate Route Frequency Ordered Stop   08/16/14 0600  ceFAZolin (ANCEF) IVPB 2 g/50 mL premix     2 g 100 mL/hr over 30 Minutes Intravenous On call  to O.R. 08/15/14 1435 08/15/14 1723   08/15/14 2300  ceFAZolin (ANCEF) IVPB 2 g/50 mL premix     2 g 100 mL/hr over 30 Minutes Intravenous Every 6 hours 08/15/14 2034 08/16/14 0557     and started on DVT prophylaxis in the form of Xarelto.   PT and OT were ordered for total hip protocol.  The patient was  allowed to be WBAT with therapy. Discharge planning was consulted to help with postop disposition and equipment needs.  Patient had a decent night on the evening of surgery.  Dr. Wynelle Link briefly discussed the surgery and the extensive arthritis found at time of surgery.  Patient was well, but has had some minor complaints of pain in the hip, requiring pain medications. Did have some slight nausea the day before following surgery.  They started to get up OOB with therapy on day one.  Hemovac drain was left in place that morning due top continued drainage and oputput.  The knee immobilizer was removed and discontinued.  Continued to work with therapy into day two.  Dressing was changed on day two and the incision was healing well and the Hemovac drain was removed.   Patient was seen in rounds by Dr. Wynelle Link on day two. He had more pain the night before after working with therapy. Worked on pain control that morning and then two sessions of therapy. Plan was to allow the patient home later that day after the second session of therapy. Both hips had collapsed over time and now the right leg is slightly longer than the left now that it has been replaced and some of the length has been restored. The opposite hip will eventually need replacing at a later date and leg length discrepancy will be addressed at that time. Patient would likely be ready to go home after this afternoon after both session of therapy. Arrangements are being made for discharge this afternoon.   Diet: Cardiac diet Activity:WBAT No bending hip over 90 degrees- A "L" Angle Do not cross legs Do not let foot roll inward When turning these  patients a pillow should be placed between the patient's legs to prevent crossing. Patients should have the affected knee fully extended when trying to sit or stand from all surfaces to prevent excessive hip flexion. When ambulating and turning toward the affected side the affected leg should have the toes turned out prior to moving the walker and the rest of patient's body as to prevent internal rotation/ turning in of the leg. Abduction pillows are the most effective way to prevent a patient from not crossing legs or turning toes in at rest. If an abduction pillow is not ordered placing a regular pillow length wise between the patient's legs is also an effective reminder. It is imperative that these precautions be maintained so that the surgical hip does not dislocate. Follow-up:in 2 weeks on Thursday October 15th Disposition - Home Discharged Condition: improving   Discharge Instructions   Call MD / Call 911    Complete by:  As directed   If you experience chest pain or shortness of breath, CALL 911 and be transported to the hospital emergency room.  If you develope a fever above 101 F, pus (white drainage) or increased drainage or redness at the wound, or calf pain, call your surgeon's office.     Change dressing    Complete by:  As directed   You may change your dressing dressing daily with sterile 4 x 4 inch gauze dressing and paper tape.  Do not submerge the incision under water.     Constipation Prevention    Complete by:  As directed   Drink plenty of fluids.  Prune juice may be helpful.  You may use a stool softener, such as Colace (over the counter) 100 mg twice a day.  Use MiraLax (over the counter) for constipation as  needed.     Diet - low sodium heart healthy    Complete by:  As directed      Discharge instructions    Complete by:  As directed   Pick up stool softner and laxative for home. Do not submerge incision under water. May shower. Continue to use ice for pain and  swelling from surgery. Hip precautions.  Total Hip Protocol.  Take Xarelto for two and a half more weeks, then discontinue Xarelto. Once the patient has completed the blood thinner regimen, then take a Baby 81 mg Aspirin daily for three more weeks.     Do not sit on low chairs, stoools or toilet seats, as it may be difficult to get up from low surfaces    Complete by:  As directed      Driving restrictions    Complete by:  As directed   No driving until released by the physician.     Follow the hip precautions as taught in Physical Therapy    Complete by:  As directed      Increase activity slowly as tolerated    Complete by:  As directed      Lifting restrictions    Complete by:  As directed   No lifting until released by the physician.     Patient may shower    Complete by:  As directed   You may shower without a dressing once there is no drainage.  Do not wash over the wound.  If drainage remains, do not shower until drainage stops.     TED hose    Complete by:  As directed   Use stockings (TED hose) for 3 weeks on both leg(s).  You may remove them at night for sleeping.     Weight bearing as tolerated    Complete by:  As directed             Medication List    STOP taking these medications       Diclofenac Sodium CR 100 MG 24 hr tablet     HYDROcodone-acetaminophen 5-325 MG per tablet  Commonly known as:  NORCO/VICODIN      TAKE these medications       acetaminophen 500 MG tablet  Commonly known as:  TYLENOL  Take 1,000 mg by mouth once as needed for mild pain or headache.     famotidine 40 MG tablet  Commonly known as:  PEPCID  Take 40 mg by mouth daily as needed for heartburn or indigestion.     methocarbamol 500 MG tablet  Commonly known as:  ROBAXIN  Take 1 tablet (500 mg total) by mouth every 6 (six) hours as needed for muscle spasms.     NIFEdipine 30 MG 24 hr tablet  Commonly known as:  PROCARDIA-XL/ADALAT CC  Take 30 mg by mouth every morning.       oxyCODONE 5 MG immediate release tablet  Commonly known as:  Oxy IR/ROXICODONE  Take 1-2 tablets (5-10 mg total) by mouth every 3 (three) hours as needed for moderate pain, severe pain or breakthrough pain.     rivaroxaban 10 MG Tabs tablet  Commonly known as:  XARELTO  - Take 1 tablet (10 mg total) by mouth daily with breakfast. Take Xarelto for two and a half more weeks, then discontinue Xarelto.  - Once the patient has completed the blood thinner regimen, then take a Baby 81 mg Aspirin daily for three more weeks.  traMADol 50 MG tablet  Commonly known as:  ULTRAM  Take 1-2 tablets (50-100 mg total) by mouth every 6 (six) hours as needed for moderate pain.     traZODone 50 MG tablet  Commonly known as:  DESYREL  Take 50 mg by mouth at bedtime.           Follow-up Information   Follow up with [facility]. (home health physical therapy)    Contact information:   [address] [place] [address] [phone]       Follow up with [facility]. (3n1 (commode), rolling walker)    Contact information:   [address] [place]  [address] [phone]       Follow up with Gearlean Alf, MD. Schedule an appointment as soon as possible for a visit on 08/30/2014. (Call office at [phone] to set up follow up appointment on Thursday October 15th.)    Specialty:  Orthopedic Surgery   Contact information:   [address] [place] [address] [place] [address] [phone]       Signed: Arlee Muslim, PA-C Orthopaedic Surgery 08/17/2014, 8:17 AM

## 2014-08-17 NOTE — Progress Notes (Signed)
Physical Therapy Treatment Patient Details Name: Cody Morales MRN: [id number] DOB: 02/20/1956 Today's Date: 08/17/2014    History of Present Illness 58 yo male s/p R posterior THA 08/15/14. Hx of DVT, HTN, L chronic hip deficits.     PT Comments    Attempted to see pt this morning however wanted to wait for pain meds, then worked with OT and having increased pain.  Pt finally agreeable later this afternoon to attempt mobility and exercises.  Pt still reporting increased pain however reports improved since this morning so ambulated to tolerance as well as performed exercises.   Follow Up Recommendations  Home health PT;Supervision/Assistance - 24 hour     Equipment Recommendations  Rolling walker with 5" wheels    Recommendations for Other Services       Precautions / Restrictions Precautions Precautions: Posterior Hip Precaution Comments: pt able to verbalize 2/3 posterior hip precautions Restrictions RLE Weight Bearing: Weight bearing as tolerated    Mobility  Bed Mobility Overal bed mobility: Needs Assistance Bed Mobility: Supine to Sit;Sit to Supine     Supine to sit: Min assist Sit to supine: Mod assist   General bed mobility comments: assist for R LE over EOB with increased time required to scoot to EOB, assist for bil LE onto bed  Transfers Overall transfer level: Needs assistance Equipment used: Rolling walker (2 wheeled) Transfers: Sit to/from Stand Sit to Stand: Mod assist         General transfer comment: verbal cues for UE and LE positioning, pt tends to keep bil legs more into extension (states L hip has difficulty with flexion) assist to rise and steady  Ambulation/Gait Ambulation/Gait assistance: Min guard Ambulation Distance (Feet): 25 Feet Assistive device: Rolling walker (2 wheeled) Gait Pattern/deviations: Step-to pattern;Antalgic;Trunk flexed     General Gait Details: very slow speed, increased time weight shifting likely due to LLD,  limited distance due to pain   Stairs            Wheelchair Mobility    Modified Rankin (Stroke Patients Only)       Balance                                    Cognition Arousal/Alertness: Awake/alert Behavior During Therapy: WFL for tasks assessed/performed Overall Cognitive Status: Within Functional Limits for tasks assessed                      Exercises Total Joint Exercises Ankle Circles/Pumps: AROM;Both;15 reps;Supine Quad Sets: AROM;Both;15 reps;Supine Heel Slides: AAROM;Right;Supine;10 reps (all exercises within precautions) Hip ABduction/ADduction: AAROM;Right;15 reps;Supine    General Comments        Pertinent Vitals/Pain Pain Assessment: 0-10 Pain Score: 6  Pain Location: R hip and posterior thigh Pain Descriptors / Indicators: Aching;Tightness Pain Intervention(s): Monitored during session;Premedicated before session;Ice applied    Home Living                      Prior Function            PT Goals (current goals can now be found in the care plan section) Progress towards PT goals: Progressing toward goals (slowly due to increased pain today)    Frequency  7X/week    PT Plan Current plan remains appropriate    Co-evaluation             End of Session Equipment  Utilized During Treatment: Gait belt Activity Tolerance: Patient limited by pain Patient left: in bed;with call bell/phone within reach     Time: 1453-1524 PT Time Calculation (min): 31 min  Charges:  $Gait Training: 8-22 mins $Therapeutic Exercise: 8-22 mins                    G Codes:      Kierria Feigenbaum,KATHrine E 08/17/2014, 4:21 PM Zenovia JarredKati Majour Frei, PT, DPT 08/17/2014 Pager: [phone]

## 2014-08-17 NOTE — Progress Notes (Signed)
Occupational Therapy Treatment Patient Details Name: Cody Morales MRN: [id number] DOB: [birth year] Today's Date: 08/17/2014    History of present illness 58 yo male s/p R posterior THA 08/15/14. Hx of DVT, HTN, L chronic hip deficits.    OT comments  Pt significantly limited by pain this visit and complaining of tightness in hamstring R LE. Only able to stand at EOB and then noted bleeding through bandage. Nursing called to room. Pt feeling woozy with sitting back on bed. BP 116/69. Assisted back to supine and made comfortable. Max time for all movement. Do not recommend d/c today. As long as pt can progress to a safe level for d/c home, recommend HHOT.    Follow Up Recommendations  Home health OT;Supervision/Assistance - 24 hour (unless pt not able to progress next visit)    Equipment Recommendations  3 in 1 bedside comode    Recommendations for Other Services      Precautions / Restrictions Precautions Precautions: Posterior Hip Precaution Comments: Reviewed all hip precautions with pt. Restrictions Weight Bearing Restrictions: No RLE Weight Bearing: Weight bearing as tolerated       Mobility Bed Mobility Overal bed mobility: Needs Assistance Bed Mobility: Supine to Sit;Sit to Supine     Supine to sit: Mod assist Sit to supine: Mod assist   General bed mobility comments: use of pad to scoot hip around due to pt having difficult scooting forward. Assist for R LE. Max increased time due to pain and tightness in R LE.   Transfers Overall transfer level: Needs assistance Equipment used: Rolling walker (2 wheeled) Transfers: Sit to/from Stand Sit to Stand: Mod assist         General transfer comment: assist to rise and steady, verbal cues hand placement, technique.    Balance                                   ADL                                         General ADL Comments: Pt stating hamstring felt very tight and had much  difficulty with EOB transfer and required max time to complete. Pt having difficulty putting weight through R hip in sitting and tended to favor L side.  With assist was able to stand but once in standing, noted bleeding through bandage so sat back down.  Pt becoming dizzy and assisted pt to supine. Nursing called to room for dressing change and assisted OT to reposition pt in bed. Pt feeling some better after a few minutes but stated pain and tightness in R LE limited activity today. Do not recommend d/c for today and nursing discussed this with pt also. Pt with tendency for R LE to turn in with sitting EOB. Emphasized pt needing to not let R LE roll inward and assisted to reposition but pt calling out in pain.       Vision                     Perception     Praxis      Cognition   Behavior During Therapy: [facility] for tasks assessed/performed Overall Cognitive Status: Within Functional Limits for tasks assessed  Extremity/Trunk Assessment               Exercises     Shoulder Instructions       General Comments      Pertinent Vitals/ Pain       Pain Assessment: 0-10 Pain Score: 7  Pain Location: R hip Pain Descriptors / Indicators: Tightness;Aching Pain Intervention(s): Patient requesting pain meds-RN notified;Repositioned;Ice applied  Home Living                                          Prior Functioning/Environment              Frequency Min 2X/week     Progress Toward Goals  OT Goals(current goals can now be found in the care plan section)  Progress towards OT goals: Not progressing toward goals - comment (pain)     Plan Discharge plan remains appropriate    Co-evaluation                 End of Session Equipment Utilized During Treatment: Gait belt;Rolling walker   Activity Tolerance Patient limited by pain   Patient Left in bed;with call bell/phone within reach   Nurse Communication           Time: [id number] OT Time Calculation (min): 44 min  Charges: OT General Charges $OT Visit: 1 Procedure OT Treatments $Therapeutic Activity: 38-52 mins  Lennox LaityStone, Burnell Matlin Stafford [phone] 08/17/2014, 11:58 AM

## 2014-08-17 NOTE — Progress Notes (Signed)
   Subjective: 2 Days Post-Op Procedure(s) (LRB): RIGHT TOTAL HIP ARTHROPLASTY (Right) Patient reports pain as moderate.   Patient seen in rounds with Dr. Lequita HaltAluisio.  He had more pain last night after working with therapy yesterday.  Work on pain control this morning and then two sessions of therapy today.  Plan will be to allow the patient home later today after the second session of therapy. Both hips had collapsed over time and now the right leg is slightly longer than the left now that it has been replaced and some of the length has been restored.  The opposite hip will eventually need replacing at a later date and leg length discrepancy will be addressed at that time.  Patient is having problems with pain in the hip, requiring pain medications Patient will likely be ready to go home alter this afternoon after both session of therapy.  Arrangements are being made for discharge this afternoon.  Objective: Vital signs in last 24 hours: Temp:  [97.9 F (36.6 C)-99.7 F (37.6 C)] 99.1 F (37.3 C) (10/02 0514) Pulse Rate:  [85-111] 111 (10/02 0514) Resp:  [17-20] 18 (10/02 0514) BP: (92-145)/(59-88) 145/88 mmHg (10/02 0514) SpO2:  [96 %-100 %] 98 % (10/02 0514)  Intake/Output from previous day:  Intake/Output Summary (Last 24 hours) at 08/17/14 0800 Last data filed at 08/17/14 0529  Gross per 24 hour  Intake 1008.33 ml  Output   2550 ml  Net -1541.67 ml    Intake/Output this shift:    Labs:  Recent Labs  08/16/14 0427 08/17/14 0438  HGB 11.9* 10.3*    Recent Labs  08/16/14 0427 08/17/14 0438  WBC 10.3 9.7  RBC 3.71* 3.18*  HCT 35.3* 30.4*  PLT 154 121*    Recent Labs  08/16/14 0427 08/17/14 0438  NA 137 140  K 4.2 3.6*  CL 103 104  CO2 22 25  BUN 10 10  CREATININE 0.81 0.86  GLUCOSE 152* 113*  CALCIUM 8.1* 8.0*   No results found for this basename: LABPT, INR,  in the last 72 hours  EXAM: General - Patient is Alert, Appropriate and Oriented Extremity  - Neurovascular intact Sensation intact distally Dorsiflexion/Plantar flexion intact Incision - clean, dry, no drainage, healing Motor Function - intact, moving foot and toes well on exam.   Assessment/Plan: 2 Days Post-Op Procedure(s) (LRB): RIGHT TOTAL HIP ARTHROPLASTY (Right) Procedure(s) (LRB): RIGHT TOTAL HIP ARTHROPLASTY (Right) Past Medical History  Diagnosis Date  . Hypertension   . Enlarged thyroid   . GERD (gastroesophageal reflux disease)   . Arthritis    Principal Problem:   OA (osteoarthritis) of hip  Estimated body mass index is 30.14 kg/(m^2) as calculated from the following:   Height as of this encounter: 5\' 11"  (1.803 m).   Weight as of this encounter: 97.977 kg (216 lb). Up with therapy Discharge home with home health Diet - Cardiac diet Follow up - in 2 weeks on Thursday October 15th Activity - WBAT Disposition - Home Condition Upon Discharge - improving D/C Meds - See DC Summary DVT Prophylaxis - Xarelto  Avel Peacerew Kiasia Chou, PA-C Orthopaedic Surgery 08/17/2014, 8:00 AM

## 2014-08-18 LAB — CBC
HEMATOCRIT: 28.2 % — AB (ref 39.0–52.0)
HEMATOCRIT: 28.8 % — AB (ref 39.0–52.0)
HEMOGLOBIN: 9.7 g/dL — AB (ref 13.0–17.0)
HEMOGLOBIN: 10.1 g/dL — AB (ref 13.0–17.0)
MCH: 32.7 pg (ref 26.0–34.0)
MCH: 32.8 pg (ref 26.0–34.0)
MCHC: 34.4 g/dL (ref 30.0–36.0)
MCHC: 35.1 g/dL (ref 30.0–36.0)
MCV: 94.9 fL (ref 78.0–100.0)
MCV: 93.5 fL (ref 78.0–100.0)
Platelets: 120 10*3/uL — ABNORMAL LOW (ref 150–400)
Platelets: 122 10*3/uL — ABNORMAL LOW (ref 150–400)
RBC: 2.97 MIL/uL — ABNORMAL LOW (ref 4.22–5.81)
RBC: 3.08 MIL/uL — AB (ref 4.22–5.81)
RDW: 13.6 % (ref 11.5–15.5)
RDW: 13.2 % (ref 11.5–15.5)
WBC: 9.3 10*3/uL (ref 4.0–10.5)
WBC: 10.6 10*3/uL — ABNORMAL HIGH (ref 4.0–10.5)

## 2014-08-18 LAB — BASIC METABOLIC PANEL
ANION GAP: 12 (ref 5–15)
BUN: 8 mg/dL (ref 6–23)
CHLORIDE: 100 meq/L (ref 96–112)
CO2: 26 meq/L (ref 19–32)
CREATININE: 0.89 mg/dL (ref 0.50–1.35)
Calcium: 8.3 mg/dL — ABNORMAL LOW (ref 8.4–10.5)
GFR calc non Af Amer: >90 mL/min (ref >90)
Glucose, Bld: 110 mg/dL — ABNORMAL HIGH (ref 70–99)
Potassium: 3.4 mEq/L — ABNORMAL LOW (ref 3.7–5.3)
Sodium: 138 mEq/L (ref 137–147)

## 2014-08-18 MED ORDER — LACTATED RINGERS IV SOLN
INTRAVENOUS | Status: DC
  Administered 2014-08-18: 17:00:00 via INTRAVENOUS

## 2014-08-18 NOTE — Progress Notes (Signed)
CARE MANAGEMENT NOTE 08/18/2014  Patient:  Cody Morales, Cody Morales   Account Number:  [id number]  Date Initiated:  08/16/2014  Documentation initiated by:  [facility]  Subjective/Objective Assessment:   adm: RIGHT TOTAL HIP ARTHROPLASTY (Right)     Action/Plan:   discharge planning   Anticipated DC Date:  08/17/2014   Anticipated DC Plan:  [place]  CM consult      [facility] Choice  HOME HEALTH   Choice offered to / List presented to:  C-1 Patient   DME arranged  3-N-1  [place]      DME agency  [place] arranged  HH-2 PT      [place].   Status of service:  Completed, signed off Medicare Important Message given?  YES (If response is "NO", the following Medicare IM given date fields will be blank) Date Medicare IM given:  08/18/2014 Medicare IM given by:  [facility] Date Additional Medicare IM given:   Additional Medicare IM given by:    Discharge Disposition:  [place]  Per UR Regulation:    If discussed at Long Length of Stay Meetings, dates discussed:    Comments:  08/17/14 11:45 Cm met with pt in room to offer choice of home health agency.  Pt chooses [facility] to render HHPT.  Pt will have 3n1 and rolling walker delivered to room prior to dishcarge by [employer].  Address and contact information verified with pt.  referral called to [facility] rep, Kristen.  CM called [facility] and tenisha requested I fax orders, facesheet, and F2F to [phone].  CM faxed requested material.  No other CM needs were communicated. Mariane Masters, BSN, CM [phone].

## 2014-08-18 NOTE — Progress Notes (Signed)
Physical Therapy Treatment Patient Details Name: Cody Morales MRN: [id number] DOB: 26-Nov-1955 Today's Date: 08/18/2014    History of Present Illness 58 yo male s/p R posterior THA 08/15/14. Hx of DVT, HTN, L chronic hip deficits.     PT Comments    Pt reports he is unable to walk this session due to fatigued after OT session and earlier PT session. Agreeable to exercises. Discussed stair negotiation difficulty and possible need for pt to stay on 1st level. Pt states he doesn't have room for a hospital bed on the 1st level of his home. Pt states he plans to sleep on couch/chair for a few days until he can practice steps more with HHPT. If he sleeps in chair,recommended pt consider purchasing an ottoman to allow for elevation of R LE. If he sleeps on couch, recommended he have family members " build up" surface with pillows/blankets as able. Again reviewed importance of adherence to hip precautions/R LE positioning especially when sitting on lower surfaces. Plan is now for d/c home on tomorrow.   Follow Up Recommendations  Home health PT;Supervision/Assistance - 24 hour     Equipment Recommendations  Rolling walker with 5" wheels    Recommendations for Other Services OT consult     Precautions / Restrictions Precautions Precautions: Posterior Hip Precaution Booklet Issued: Yes (comment) Precaution Comments: pt able to verbalize 2/3 posterior hip precautions Restrictions Weight Bearing Restrictions: No RLE Weight Bearing: Weight bearing as tolerated    Mobility  Bed Mobility Overal bed mobility: Needs Assistance Bed Mobility: Sit to Supine       Sit to supine: Mod assist   General bed mobility comments:  Assist for bil LEs onto bed. Increased time. Cues for adherence to hip precautions.   Transfers           Ambulation/Gait                 Stairs         General  comments: pt declined ambulation this session due to UE fatigue "my triceps are gone from  practicing those steps earlier."   Wheelchair Mobility    Modified Rankin (Stroke Patients Only)       Balance                                    Cognition Arousal/Alertness: Awake/alert Behavior During Therapy: WFL for tasks assessed/performed Overall Cognitive Status: Within Functional Limits for tasks assessed                      Exercises Total Joint Exercises Ankle Circles/Pumps: AROM;Both;10 reps;Supine Quad Sets: AROM;Both;10 reps;Supine Heel Slides: AAROM;Right;10 reps;Supine Hip ABduction/ADduction: AAROM;Right;10 reps;Supine    General Comments        Pertinent Vitals/Pain Pain Assessment: 0-10 Pain Score: 7  Pain Location: R hip, thigh, knee Pain Intervention(s): Ice applied;Monitored during session;Patient requesting pain meds-RN notified    Home Living                      Prior Function            PT Goals (current goals can now be found in the care plan section) Progress towards PT goals: Progressing toward goals    Frequency  7X/week    PT Plan Current plan remains appropriate    Co-evaluation  End of Session   Activity Tolerance: Patient limited by pain;Patient limited by fatigue Patient left: in bed;with call bell/phone within reach     Time: 1533-1610 PT Time Calculation (min): 37 min  Charges:  $Therapeutic Exercise: 8-22 mins $Therapeutic Activity: 8-22 mins                    G Codes:      Rebeca AlertJannie Jhada Risk, MPT Pager: [phone]

## 2014-08-18 NOTE — Progress Notes (Signed)
Subjective: Mr. Cody Morales reports feeling better today compared to yesterday. He states that "yesterday was a bad day" with respects to his PT performance. He feels that he will do much better today, and will be able to go home as planned. He has no pain into his R hip/groin, but rather complains of hamstring and glute discomfort when up with PT; it has improved since yesterday. He has no new complaints. He denies any new HA, CP, SOB, N,V, fever, chills, leg swelling.   Objective: Vital signs in last 24 hours: Temp:  [99.3 F (37.4 C)-102 F (38.9 C)] 100.6 F (38.1 C) (10/03 0609) Pulse Rate:  [102-112] 102 (10/03 0609) Resp:  [18] 18 (10/03 0609) BP: (116-133)/(66-80) 124/66 mmHg (10/03 0609) SpO2:  [94 %-96 %] 96 % (10/03 0609)  Intake/Output from previous day: 10/02 0701 - 10/03 0700 In: 655 [P.O.:600; IV Piggyback:55] Out: 1300 [Urine:1300] Intake/Output this shift:     Recent Labs  08/16/14 0427 08/17/14 0438 08/18/14 0446  HGB 11.9* 10.3* 9.7*    Recent Labs  08/17/14 0438 08/18/14 0446  WBC 9.7 9.3  RBC 3.18* 2.97*  HCT 30.4* 28.2*  PLT 121* 120*    Recent Labs  08/16/14 0427 08/17/14 0438  NA 137 140  K 4.2 3.6*  CL 103 104  CO2 22 25  BUN 10 10  CREATININE 0.81 0.86  GLUCOSE 152* 113*  CALCIUM 8.1* 8.0*   No results found for this basename: LABPT, INR,  in the last 72 hours  WD/WN african [place] male in nad. A and O x4. Mood and affect appropriate. EOMI. Respirations normal and unlabored. No tenderness to R hip or thigh musculature. No thigh or calf swelling ro palpable cords. NV intact wth distal pulses at 2+ and 5/5 toe extensors and flexors bilaterally. No lymphadenopathy.   Assessment/Plan: Up with PT today; WBAT D/C home today; continue with plans from D/C summary  Continue with hip precautions F/U in clinic in 2 weeks    Moriah Loughry HOWELLS 08/18/2014, 7:17 AM  [phone]

## 2014-08-18 NOTE — Progress Notes (Signed)
Physical Therapy Treatment Patient Details Name: Cody Morales MRN: [id number] DOB: 08-20-1956 Today's Date: 08/18/2014    History of Present Illness 58 yo male s/p R posterior THA 08/15/14. Hx of DVT, HTN, L chronic hip deficits.     PT Comments    Progressing slowly with mobility. Pt continues to report increased pain R hip, thigh, knee. Significant difficulty ascending steps (pt has full flight up to bedroom). Pt became "woozy" after walking ~30 feet. BP 94/70. Pt only able to safely get up and down 2 steps on today. May need to consider staying downstairs once home. May need hospital bed. Will attempt to see a 2nd session to assess performance. May need to consider staying another day.   Follow Up Recommendations  Home health PT;Supervision/Assistance - 24 hour     Equipment Recommendations  Rolling walker with 5" wheels    Recommendations for Other Services OT consult     Precautions / Restrictions Precautions Precautions: Posterior Hip Precaution Booklet Issued: Yes (comment) Precaution Comments: pt able to verbalize 2/3 posterior hip precautions Restrictions Weight Bearing Restrictions: No RLE Weight Bearing: Weight bearing as tolerated    Mobility  Bed Mobility Overal bed mobility: Needs Assistance Bed Mobility: Supine to Sit     Supine to sit: Min assist Sit to supine: Mod assist   General bed mobility comments: Assist for R LE to get to EOB. Assist for bil LEs onto bed. Increased time. Cues for adherence to hip precautions.   Transfers Overall transfer level: Needs assistance Equipment used: Rolling walker (2 wheeled) Transfers: Sit to/from Stand Sit to Stand: Mod assist         General transfer comment: Assist to rise, stabilize, control descent, R LE positioning. Increased time.   Ambulation/Gait Ambulation/Gait assistance: Min guard Ambulation Distance (Feet): 30 Feet Assistive device: Rolling walker (2 wheeled) Gait Pattern/deviations: Step-to  pattern;Decreased stride length;Trunk flexed;Antalgic     General Gait Details: slow gait speed. Distance limited due to pain, pt c/o "feelling woozy." BP 94/70. Pt c/o R buttock/hamstring, knee pain with mobilizing. Unable to walk back to room so returned to room in recliner.    Stairs Stairs: Yes   Stair Management: One rail Left;Step to pattern;Forwards;With crutches Number of Stairs: 5 (2 normal height, 3 smaller height) General stair comments: up and over portable steps x2 with 1 crutch, 1 rail. Pt has difficulty flexing L hip at baseline so stair negotiation was difficult as well. May consider practicing steps backwards with walker, however do not feel this is safest option for pt to ascend a full flight of steps  Wheelchair Mobility    Modified Rankin (Stroke Patients Only)       Balance                                    Cognition Arousal/Alertness: Awake/alert Behavior During Therapy: WFL for tasks assessed/performed Overall Cognitive Status: Within Functional Limits for tasks assessed                      Exercises      General Comments        Pertinent Vitals/Pain Pain Assessment: 0-10 Pain Score: 7  Pain Location: R hip, thigh, knee Pain Descriptors / Indicators: Tightness;Aching;Throbbing Pain Intervention(s): Monitored during session;Patient requesting pain meds-RN notified;Limited activity within patient's tolerance;Ice applied    Home Living  Prior Function            PT Goals (current goals can now be found in the care plan section) Progress towards PT goals: Progressing toward goals (slowly)    Frequency  7X/week    PT Plan Current plan remains appropriate    Co-evaluation             End of Session Equipment Utilized During Treatment: Gait belt Activity Tolerance: Patient limited by fatigue;Patient limited by pain Patient left: in bed;with call bell/phone within reach      Time: 1105-1212 PT Time Calculation (min): 67 min  Charges:  $Gait Training: 38-52 mins $Therapeutic Activity: 8-22 mins                    G Codes:      Rebeca AlertJannie Arnisha Laffoon, MPT Pager: [phone]

## 2014-08-19 LAB — BASIC METABOLIC PANEL
ANION GAP: 10 (ref 5–15)
BUN: 9 mg/dL (ref 6–23)
CO2: 27 meq/L (ref 19–32)
CREATININE: 0.86 mg/dL (ref 0.50–1.35)
Calcium: 8.1 mg/dL — ABNORMAL LOW (ref 8.4–10.5)
Chloride: 101 mEq/L (ref 96–112)
GFR calc Af Amer: >90 mL/min (ref >90)
GFR calc non Af Amer: >90 mL/min (ref >90)
GLUCOSE: 100 mg/dL — AB (ref 70–99)
Potassium: 3.2 mEq/L — ABNORMAL LOW (ref 3.7–5.3)
Sodium: 138 mEq/L (ref 137–147)

## 2014-08-19 LAB — CBC
HCT: 27.2 % — ABNORMAL LOW (ref 39.0–52.0)
Hemoglobin: 9.4 g/dL — ABNORMAL LOW (ref 13.0–17.0)
MCH: 32.6 pg (ref 26.0–34.0)
MCHC: 34.6 g/dL (ref 30.0–36.0)
MCV: 94.4 fL (ref 78.0–100.0)
PLATELETS: 140 10*3/uL — AB (ref 150–400)
RBC: 2.88 MIL/uL — AB (ref 4.22–5.81)
RDW: 13 % (ref 11.5–15.5)
WBC: 8.2 10*3/uL (ref 4.0–10.5)

## 2014-08-19 MED ORDER — POTASSIUM CHLORIDE CRYS ER 20 MEQ PO TBCR
40.0000 meq | EXTENDED_RELEASE_TABLET | Freq: Once | ORAL | Status: AC
  Administered 2014-08-19: 40 meq via ORAL
  Filled 2014-08-19: qty 2

## 2014-08-19 NOTE — Progress Notes (Signed)
Physical Therapy Treatment Patient Details Name: Cody Morales MRN: [id number] DOB: 1956/04/23 Today's Date: 08/19/2014    History of Present Illness 58 yo male s/p R posterior THA 08/15/14. Hx of DVT, HTN, L chronic hip deficits.     PT Comments    Practiced bed mobility, ambulation, stair negotiation. Discussed car transfer technique. Pt states he will plan to stay on 1st level for a night or two. He plans to practice steps more with HHPT. Tolerated session well. Rated pain as 5/10 with activity. Ready to d/c home from PT standpoint-made RN aware.   Follow Up Recommendations  Supervision/Assistance - 24 hour; Home Health PT     Equipment Recommendations  Rolling walker with 5" wheels    Recommendations for Other Services OT consult     Precautions / Restrictions Precautions Precautions: Posterior Hip Precaution Booklet Issued: Yes (comment) Precaution Comments: pt able to verbalize 2/3 posterior hip precautions Restrictions Weight Bearing Restrictions: No RLE Weight Bearing: Weight bearing as tolerated    Mobility  Bed Mobility   Bed Mobility: Supine to Sit;Sit to Supine     Supine to sit: Min assist Sit to supine: Min assist   General bed mobility comments:  Assist for bil LEs onto bed. Increased time. Cues for adherence to hip precautions.   Transfers Overall transfer level: Needs assistance Equipment used: Rolling walker (2 wheeled) Transfers: Sit to/from Stand Sit to Stand: Min assist         General transfer comment: Assist to rise, stabilize, control descent, R LE positioning. Increased time. difficulty  rising due to poor ROM, strength on L LE. Bed elevated.  Ambulation/Gait Ambulation/Gait assistance: Min guard Ambulation Distance (Feet): 75 Feet Assistive device: Rolling walker (2 wheeled) Gait Pattern/deviations: Step-to pattern;Decreased stride length;Antalgic     General Gait Details: slow gait speed. tolerated fairly well.     Stairs Stairs: Yes Stairs assistance: Min assist Stair Management: Step to pattern;Forwards;With crutches;One rail Left Number of Stairs: 5 General stair comments: up and over portable steps x2 with 1 crutch, 2 rail. VC safety, technique, sequence. increased time.   Wheelchair Mobility    Modified Rankin (Stroke Patients Only)       Balance                                    Cognition Arousal/Alertness: Awake/alert Behavior During Therapy: WFL for tasks assessed/performed Overall Cognitive Status: Within Functional Limits for tasks assessed                      Exercises      General Comments        Pertinent Vitals/Pain Pain Assessment: 0-10 Pain Score: 5  Pain Location: R hip, thigh Pain Descriptors / Indicators: Tightness Pain Intervention(s): Monitored during session    Home Living                      Prior Function            PT Goals (current goals can now be found in the care plan section) Progress towards PT goals: Progressing toward goals    Frequency  7X/week    PT Plan Current plan remains appropriate    Co-evaluation             End of Session Equipment Utilized During Treatment: Gait belt Activity Tolerance: Patient tolerated treatment well Patient left: with call  bell/phone within reach     Time: [id number] PT Time Calculation (min): 29 min  Charges:  $Gait Training: 23-37 mins                    G Codes:      Rebeca AlertJannie Chidera Dearcos, MPT Pager: [phone]

## 2014-08-19 NOTE — Progress Notes (Signed)
Subjective: 4 Days Post-Op Procedure(s) (LRB): RIGHT TOTAL HIP ARTHROPLASTY (Right) Patient reports pain as 3 on 0-10 scale.    Objective: Vital signs in last 24 hours: Temp:  [98.5 F (36.9 C)-100.4 F (38 C)] 99.1 F (37.3 C) (10/04 0540) Pulse Rate:  [95-104] 95 (10/04 0540) Resp:  [20] 20 (10/04 0540) BP: (112-124)/(58-78) 123/78 mmHg (10/04 0540) SpO2:  [94 %-99 %] 98 % (10/04 0540)  Intake/Output from previous day: 10/03 0701 - 10/04 0700 In: 1480 [P.O.:840; I.V.:640] Out: 1700 [Urine:1700] Intake/Output this shift:     Recent Labs  08/17/14 0438 08/18/14 0446 08/18/14 1417 08/19/14 0405  HGB 10.3* 9.7* 10.1* 9.4*    Recent Labs  08/18/14 1417 08/19/14 0405  WBC 10.6* 8.2  RBC 3.08* 2.88*  HCT 28.8* 27.2*  PLT 122* 140*    Recent Labs  08/18/14 1417 08/19/14 0405  NA 138 138  K 3.4* 3.2*  CL 100 101  CO2 26 27  BUN 8 9  CREATININE 0.89 0.86  GLUCOSE 110* 100*  CALCIUM 8.3* 8.1*   No results found for this basename: LABPT, INR,  in the last 72 hours  Neurologically intact Neurovascular intact Dorsiflexion/Plantar flexion intact Compartment soft  Assessment/Plan: 4 Days Post-Op Procedure(s) (LRB): RIGHT TOTAL HIP ARTHROPLASTY (Right) D/C IV fluids Discharge home with home health Foods high in potassium discussed with patient. F/U Dr. Mallory ShirkAlusio  Korde Jeppsen C 08/19/2014, 8:17 AM

## 2014-08-19 NOTE — Progress Notes (Signed)
PT Cancellation Note  Patient Details Name: Jobe IgoDonald Fielder MRN: [id number] DOB: [birth year]   Cancelled Treatment:    Reason Eval/Treat Not Completed: Other (comment) (attempted PT session. Pt requested therapy to check back after lunch for session. Pt states he is not leaving until later. )   Rebeca AlertJannie Lakeya Mulka, MPT Pager: [phone]

## 2014-08-19 NOTE — Progress Notes (Signed)
Pt discharged home. Prescriptions given; discharge instructions given and pt verbalized s/s of worsening conditions and when to call the surgeon.   Ok AnisSanders,Camary Sosa A, RN 7:05 PM 08/19/2014

## 2014-12-04 ENCOUNTER — Ambulatory Visit: Payer: Self-pay | Admitting: Orthopedic Surgery

## 2014-12-04 NOTE — H&P (Signed)
Cody Morales DOB: 11/18/1955 Divorced / Language: English / Race: Black or African American Male Date of Admission: 12/26/2014 CC: Left Hip Pain History of Present Illness The patient is a 59 year old male who comes in for a preoperative History and Physical. The patient is scheduled for a left total hip arthroplasty (anterior approach) to be performed by Dr. Frank V. Aluisio, MD at [facility] on 12-26-2014. The patient comes in now several months out from right total hip arthroplasty. The patient states that he is doing very well at this time. The pain is under excellent control at this time and describes their pain as mild. They are currently on Tylenol (occasionally) and Ultram for their pain. The patient is currently doing home exercise program. The patient feels that they are progressing well at this time. Note for "Post operative hip": His left hip is killing him. It continues to impact his mobility. He is ready for surgery. He is really pleased with how his right hip is doing. He said now that the right hip is feeling fine, the left hip is hurting more noticeably. This is definitely what is limiting him at this time. He is ready to proceed with the left hip at this time. They have been treated conservatively in the past for the above stated problem and despite conservative measures, they continue to have progressive pain and severe functional limitations and dysfunction. They have failed non-operative management including home exercise, medications. It is felt that they would benefit from undergoing total joint replacement. Risks and benefits of the procedure have been discussed with the patient and they elect to proceed with surgery. There are no active contraindications to surgery such as ongoing infection or rapidly progressive neurological disease.  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 Treated with Coumadin  Allergies No Known Drug Allergies  Family History Heart Disease Maternal Grandmother. First Degree Relatives reported Heart disease in male family member before age 65  Social History Living situation live with partner Exercise Exercises rarely; does running / walking Marital status divorced Tobacco / smoke exposure 05/01/2014: yes Number of flights of stairs before winded 2-3 Current drinker 05/01/2014: Currently drinks wine 5-7 times per week Not under pain contract No history of drug/alcohol rehab Tobacco use Current some day smoker. 05/01/2014: smoke(d) 3 or more pack(s) per day Children 1 Current work status retired  Medication History Tylenol Extra Strength (500MG Tablet, Oral as needed) Active. Nifedical XL (30MG Tablet ER 24HR, Oral) Active. Tumeric Active.  Past Surgical History Bilateral Hip Pinning for SCFE's Inguinal Hernia Repair open: bilateral Total Hip Replacement - Right  Review of Systems  General Not Present- Chills, Fatigue, Fever, Memory Loss, Night Sweats, Weight Gain and Weight Loss. Skin Not Present- Eczema, Hives, Itching, Lesions and Rash. HEENT Not Present- Dentures, Double Vision, Headache, Hearing Loss, Tinnitus and Visual Loss. Respiratory Not Present- Allergies, Chronic Cough, Coughing up blood, Shortness of breath at rest and Shortness of breath with exertion. Cardiovascular Not Present- Chest Pain, Difficulty Breathing Lying Down, Murmur, Palpitations, Racing/skipping heartbeats and Swelling. Gastrointestinal Not Present- Abdominal Pain, Bloody Stool, Constipation, Diarrhea, Difficulty Swallowing, Heartburn, Jaundice, Loss of appetitie, Nausea and Vomiting. Male Genitourinary Not Present- Blood in Urine, Discharge, Flank Pain, Incontinence, Painful Urination, Urgency, Urinary frequency, Urinary Retention, Urinating at Night and Weak urinary  stream. Musculoskeletal Present- Morning Stiffness and Muscle Pain. Not Present- Back Pain, Joint   Pain, Joint Swelling, Muscle Weakness and Spasms. Neurological Not Present- Blackout spells, Difficulty with balance, Dizziness, Paralysis, Tremor and Weakness. Psychiatric Not Present- Insomnia.   Vitals Weight: 221 lb Height: 71in Body Surface Area: 2.2 m Body Mass Index: 30.82 kg/m  BP: 130/78 (Sitting, Right Arm, Standard)   Physical Exam General Mental Status -Alert, cooperative and good historian. General Appearance-pleasant, Not in acute distress. Orientation-Oriented X3. Build & Nutrition-Well nourished and Well developed. Gait-Antalgic and Limping.  Head and Neck Head-normocephalic, atraumatic . Neck Global Assessment - supple, no bruit auscultated on the right, no bruit auscultated on the left.  Eye Pupil - Bilateral-Regular and Round. Motion - Bilateral-EOMI.  Chest and Lung Exam Auscultation Breath sounds - clear at anterior chest wall and clear at posterior chest wall. Adventitious sounds - No Adventitious sounds.  Cardiovascular Auscultation Rhythm - Regular rate and rhythm. Heart Sounds - S1 WNL and S2 WNL. Murmurs & Other Heart Sounds - Auscultation of the heart reveals - No Murmurs.  Abdomen Palpation/Percussion Tenderness - Abdomen is non-tender to palpation. Rigidity (guarding) - Abdomen is soft. Auscultation Auscultation of the abdomen reveals - Bowel sounds normal.  Male Genitourinary Note: Not done, not pertinent to present illness   Musculoskeletal Note: He is a well developed male. He is alert and oriented. No apparent distress. The left hip flexion is to about 90, about 5-10 external rotation and about 10 degrees of abduction. The knee exams are normal. Pulse, sensation and motor are intact in both lower extremities. He has a very stiff legged antalgic gait pattern.  RADIOGRAPHS: AP pelvis and lateral of hip shows  severe end stage arthritic change left hip. On the left he also has severe bone on bone arthritis with subchondral cystic formation but less deformity.   Assessment & Plan Osteoarthritis of left hip (M16.12) Note:Plan is for a Left Total Hip Replacement - anterior approach by Dr. Aluisio.  Plan is to go home.  PCP - Dr. S. Hilton - Patient has been seen preoperatively and felt to be stable for surgery.  Topical TXA - History of Blood Clot  Signed electronically by Shylah Dossantos L Marston Mccadden, III PA-C 

## 2014-12-19 NOTE — Progress Notes (Signed)
Please put orders in EPIC as patient has a pre-op appointment at [facility] tomorrow on 12/20/2014! Thank you!

## 2014-12-19 NOTE — Patient Instructions (Signed)
Cody Morales  12/19/2014   Your procedure is scheduled on: Wednaesday 12/26/2014  [facility]  Entrance and follow signs to               Short Stay Center at 1125 AM.  Call this number if you have problems the morning of surgery [phone]   Remember:  Do not eat food  :After Midnight.  MAY HAVE CLEAR LIQUIDS FROM MIDNIGHT UP UNTIL 0725 AM THEN NOTHING UNTIL AFTER SURGERY!     Take these medicines the morning of surgery with A SIP OF WATER: NIFEDIPINE                               You may not have any metal on your body including hair pins and              piercings  Do not wear jewelry, make-up, lotions, powders or perfumes.             Do not wear nail polish.  Do not shave  48 hours prior to surgery.              Men may shave face and neck.   Do not bring valuables to the hospital. [facility] IS NOT             RESPONSIBLE   FOR VALUABLES.  Contacts, dentures or bridgework may not be worn into surgery.  Leave suitcase in the car. After surgery it may be brought to your room.     Patients discharged the day of surgery will not be allowed to drive home.  Name and phone number of your driver:  Special Instructions: N/A              Please read over the following fact sheets you were given: _____________________________________________________________________             [facility] - Preparing [facility] Before surgery, you can play an important role.  Because skin is not sterile, your skin needs to be as free of germs as possible.  You can reduce the number of germs on your skin by washing with CHG (chlorahexidine gluconate) soap before surgery.  CHG is an antiseptic cleaner which kills germs and bonds with the skin to continue killing germs even after washing. Please DO NOT use if you have an allergy to CHG or antibacterial soaps.  If your skin becomes reddened/irritated stop using the CHG and inform your nurse when you arrive at  Short Stay. Do not shave (including legs and underarms) for at least 48 hours prior to the first CHG shower.  You may shave your face/neck. Please follow these instructions carefully:  1.  Shower with CHG Soap the night before surgery and the  morning of Surgery.  2.  If you choose to wash your hair, wash your hair first as usual with your  normal  shampoo.  3.  After you shampoo, rinse your hair and body thoroughly to remove the  shampoo.                           4.  Use CHG as you would any other liquid soap.  You can apply chg directly  to the skin and wash  Gently with a scrungie or clean washcloth.  5.  Apply the CHG Soap to your body ONLY FROM THE NECK DOWN.   Do not use on face/ open                           Wound or open sores. Avoid contact with eyes, ears mouth and genitals (private parts).                       Wash face,  Genitals (private parts) with your normal soap.             6.  Wash thoroughly, paying special attention to the area where your surgery  will be performed.  7.  Thoroughly rinse your body with warm water from the neck down.  8.  DO NOT shower/wash with your normal soap after using and rinsing off  the CHG Soap.                9.  Pat yourself dry with a clean towel.            10.  Wear clean pajamas.            11.  Place clean sheets on your bed the night of your first shower and do not  sleep with pets. Day of Surgery : Do not apply any lotions/deodorants the morning of surgery.  Please wear clean clothes to the hospital/surgery center.  FAILURE TO FOLLOW THESE INSTRUCTIONS MAY RESULT IN THE CANCELLATION OF YOUR SURGERY PATIENT SIGNATURE_________________________________  NURSE SIGNATURE__________________________________  ________________________________________________________________________   Adam Phenix  An incentive spirometer is a tool that can help keep your lungs clear and active. This tool measures how well you are  filling your lungs with each breath. Taking long deep breaths may help reverse or decrease the chance of developing breathing (pulmonary) problems (especially infection) following:  A long period of time when you are unable to move or be active. BEFORE THE PROCEDURE   If the spirometer includes an indicator to show your best effort, your nurse or respiratory therapist will set it to a desired goal.  If possible, sit up straight or lean slightly forward. Try not to slouch.  Hold the incentive spirometer in an upright position. INSTRUCTIONS FOR USE   Sit on the edge of your bed if possible, or sit up as far as you can in bed or on a chair.  Hold the incentive spirometer in an upright position.  Breathe out normally.  Place the mouthpiece in your mouth and seal your lips tightly around it.  Breathe in slowly and as deeply as possible, raising the piston or the ball toward the top of the column.  Hold your breath for 3-5 seconds or for as long as possible. Allow the piston or ball to fall to the bottom of the column.  Remove the mouthpiece from your mouth and breathe out normally.  Rest for a few seconds and repeat Steps 1 through 7 at least 10 times every 1-2 hours when you are awake. Take your time and take a few normal breaths between deep breaths.  The spirometer may include an indicator to show your best effort. Use the indicator as a goal to work toward during each repetition.  After each set of 10 deep breaths, practice coughing to be sure your lungs are clear. If you have an incision (the cut made at the time of surgery),  support your incision when coughing by placing a pillow or rolled up towels firmly against it. Once you are able to get out of bed, walk around indoors and cough well. You may stop using the incentive spirometer when instructed by your caregiver.  RISKS AND COMPLICATIONS  Take your time so you do not get dizzy or light-headed.  If you are in pain, you may  need to take or ask for pain medication before doing incentive spirometry. It is harder to take a deep breath if you are having pain. AFTER USE  Rest and breathe slowly and easily.  It can be helpful to keep track of a log of your progress. Your caregiver can provide you with a simple table to help with this. If you are using the spirometer at home, follow these instructions: [place] IF:   You are having difficultly using the spirometer.  You have trouble using the spirometer as often as instructed.  Your pain medication is not giving enough relief while using the spirometer.  You develop fever of 100.5 F (38.1 C) or higher. SEEK IMMEDIATE MEDICAL CARE IF:   You cough up bloody sputum that had not been present before.  You develop fever of 102 F (38.9 C) or greater.  You develop worsening pain at or near the incision site. MAKE SURE YOU:   Understand these instructions.  Will watch your condition.  Will get help right away if you are not doing well or get worse. Document Released: 03/15/2007 Document Revised: 01/25/2012 Document Reviewed: 05/16/2007 ExitCare Patient Information 2014 ExitCare, [place].   ________________________________________________________________________  WHAT IS A BLOOD TRANSFUSION? Blood Transfusion Information  A transfusion is the replacement of blood or some of its parts. Blood is made up of multiple cells which provide different functions.  Red blood cells carry oxygen and are used for blood loss replacement.  White blood cells fight against infection.  Platelets control bleeding.  Plasma helps clot blood.  Other blood products are available for specialized needs, such as hemophilia or other clotting disorders. BEFORE THE TRANSFUSION  Who gives blood for transfusions?   Healthy volunteers who are fully evaluated to make sure their blood is safe. This is blood bank blood. Transfusion therapy is the safest it has ever been in  the practice of medicine. Before blood is taken from a donor, a complete history is taken to make sure that person has no history of diseases nor engages in risky social behavior (examples are intravenous drug use or sexual activity with multiple partners). The donor's travel history is screened to minimize risk of transmitting infections, such as malaria. The donated blood is tested for signs of infectious diseases, such as HIV and hepatitis. The blood is then tested to be sure it is compatible with you in order to minimize the chance of a transfusion reaction. If you or a relative donates blood, this is often done in anticipation of surgery and is not appropriate for emergency situations. It takes many days to process the donated blood. RISKS AND COMPLICATIONS Although transfusion therapy is very safe and saves many lives, the main dangers of transfusion include:   Getting an infectious disease.  Developing a transfusion reaction. This is an allergic reaction to something in the blood you were given. Every precaution is taken to prevent this. The decision to have a blood transfusion has been considered carefully by your caregiver before blood is given. Blood is not given unless the benefits outweigh the risks. AFTER THE TRANSFUSION  Right after receiving a blood transfusion, you will usually feel much better and more energetic. This is especially true if your red blood cells have gotten low (anemic). The transfusion raises the level of the red blood cells which carry oxygen, and this usually causes an energy increase.  The nurse administering the transfusion will monitor you carefully for complications. HOME CARE INSTRUCTIONS  No special instructions are needed after a transfusion. You may find your energy is better. Speak with your caregiver about any limitations on activity for underlying diseases you may have. SEEK MEDICAL CARE IF:   Your condition is not improving after your  transfusion.  You develop redness or irritation at the intravenous (IV) site. SEEK IMMEDIATE MEDICAL CARE IF:  Any of the following symptoms occur over the next 12 hours:  Shaking chills.  You have a temperature by mouth above 102 F (38.9 C), not controlled by medicine.  Chest, back, or muscle pain.  People around you feel you are not acting correctly or are confused.  Shortness of breath or difficulty breathing.  Dizziness and fainting.  You get a rash or develop hives.  You have a decrease in urine output.  Your urine turns a dark color or changes to pink, red, or brown. Any of the following symptoms occur over the next 10 days:  You have a temperature by mouth above 102 F (38.9 C), not controlled by medicine.  Shortness of breath.  Weakness after normal activity.  The white part of the eye turns yellow (jaundice).  You have a decrease in the amount of urine or are urinating less often.  Your urine turns a dark color or changes to pink, red, or brown. Document Released: 10/30/2000 Document Revised: 01/25/2012 Document Reviewed: 06/18/2008 ExitCare Patient Information 2014 ExitCare, [place].  _______________________________________________________________________   CLEAR LIQUID DIET   Foods Allowed                                                                     Foods Excluded  Coffee and tea, regular and decaf                             liquids that you cannot  Plain Jell-O in any flavor                                             see through such as: Fruit ices (not with fruit pulp)                                     milk, soups, orange juice  Iced Popsicles                                    All solid food Carbonated beverages, regular and diet  Cranberry, grape and apple juices Sports drinks like Gatorade Lightly seasoned clear broth or consume(fat free) Sugar, honey syrup  Sample Menu Breakfast                                 Lunch                                     Supper Cranberry juice                    Beef broth                            Chicken broth Jell-O                                     Grape juice                           Apple juice Coffee or tea                        Jell-O                                      Popsicle                                                Coffee or tea                        Coffee or tea  _____________________________________________________________________

## 2014-12-20 ENCOUNTER — Encounter (HOSPITAL_COMMUNITY): Payer: Self-pay

## 2014-12-20 ENCOUNTER — Encounter (HOSPITAL_COMMUNITY)
Admission: RE | Admit: 2014-12-20 | Discharge: 2014-12-20 | Disposition: A | Discharge status: Home / Self Care | Payer: Medicare PPO | Source: Ambulatory Visit | Attending: Orthopedic Surgery | Admitting: Orthopedic Surgery

## 2014-12-20 ENCOUNTER — Ambulatory Visit: Payer: Self-pay | Admitting: Orthopedic Surgery

## 2014-12-20 DIAGNOSIS — M1612 Unilateral primary osteoarthritis, left hip: Secondary | ICD-10-CM | POA: Diagnosis not present

## 2014-12-20 DIAGNOSIS — Z01818 Encounter for other preprocedural examination: Secondary | ICD-10-CM | POA: Diagnosis not present

## 2014-12-20 LAB — CBC
HEMATOCRIT: 43.9 % (ref 39.0–52.0)
Hemoglobin: 14.4 g/dL (ref 13.0–17.0)
MCH: 29.3 pg (ref 26.0–34.0)
MCHC: 32.8 g/dL (ref 30.0–36.0)
MCV: 89.4 fL (ref 78.0–100.0)
Platelets: 185 10*3/uL (ref 150–400)
RBC: 4.91 MIL/uL (ref 4.22–5.81)
RDW: 15.1 % (ref 11.5–15.5)
WBC: 5.6 10*3/uL (ref 4.0–10.5)

## 2014-12-20 LAB — BASIC METABOLIC PANEL
ANION GAP: 9 (ref 5–15)
BUN: 11 mg/dL (ref 6–23)
CALCIUM: 9.2 mg/dL (ref 8.4–10.5)
CO2: 24 mmol/L (ref 19–32)
CREATININE: 0.89 mg/dL (ref 0.50–1.35)
Chloride: 106 mmol/L (ref 96–112)
GLUCOSE: 97 mg/dL (ref 70–99)
Potassium: 3.9 mmol/L (ref 3.5–5.1)
Sodium: 139 mmol/L (ref 135–145)

## 2014-12-20 LAB — SURGICAL PCR SCREEN
MRSA, PCR: NEGATIVE
STAPHYLOCOCCUS AUREUS: NEGATIVE

## 2014-12-20 LAB — PROTIME-INR
INR: 1.03 (ref 0.00–1.49)
PROTHROMBIN TIME: 13.6 s (ref 11.6–15.2)

## 2014-12-20 LAB — APTT: aPTT: 28 seconds (ref 24–37)

## 2014-12-20 NOTE — Progress Notes (Signed)
07/16/2014-EKG and Pre-operative clearance note from Dr. Cristina GongHilton's office on chart.

## 2014-12-20 NOTE — Progress Notes (Signed)
   12/20/14 1132  OBSTRUCTIVE SLEEP APNEA  Have you ever been diagnosed with sleep apnea through a sleep study? No  Do you snore loudly (loud enough to be heard through closed doors)?  0  Do you often feel tired, fatigued, or sleepy during the daytime? 0  Has anyone observed you stop breathing during your sleep? 0  Do you have, or are you being treated for high blood pressure? 1  BMI more than 35 kg/m2? 0  Age over 58 years old? 1  Neck circumference greater than 40 cm/16 inches? 1  Gender: 1  Obstructive Sleep Apnea Score 4  Score 4 or greater  Results sent to PCP

## 2014-12-20 NOTE — Progress Notes (Signed)
Preoperative surgical orders have been place into the Epic hospital system for Cody Morales on 12/20/2014, 12:52 PM  by Patrica DuelPERKINS, Aryel Edelen for surgery on 12-26-2014.  Preop Total Hip - Anterior Approach orders including Experel Injecion, IV Tylenol, and IV Decadron as long as there are no contraindications to the above medications. Cody Peacerew Mailee Klaas, PA-C

## 2014-12-25 ENCOUNTER — Ambulatory Visit: Payer: Self-pay | Admitting: Orthopedic Surgery

## 2014-12-25 NOTE — H&P (Signed)
Cody Morales DOB: 04/02/1956 Divorced / Language: English / Race: Black or African American Male Date of Admission: 12/26/2014 CC: Left Hip Pain History of Present Illness The patient is a 58 year old male who comes in for a preoperative History and Physical. The patient is scheduled for a left total hip arthroplasty (anterior approach) to be performed by Dr. Frank V. Aluisio, MD at [facility] on 12-26-2014. The patient comes in now several months out from right total hip arthroplasty. The patient states that he is doing very well at this time. The pain is under excellent control at this time and describes their pain as mild. They are currently on Tylenol (occasionally) and Ultram for their pain. The patient is currently doing home exercise program. The patient feels that they are progressing well at this time. Note for "Post operative hip": His left hip is killing him. It continues to impact his mobility. He is ready for surgery. He is really pleased with how his right hip is doing. He said now that the right hip is feeling fine, the left hip is hurting more noticeably. This is definitely what is limiting him at this time. He is ready to proceed with the left hip at this time. They have been treated conservatively in the past for the above stated problem and despite conservative measures, they continue to have progressive pain and severe functional limitations and dysfunction. They have failed non-operative management including home exercise, medications. It is felt that they would benefit from undergoing total joint replacement. Risks and benefits of the procedure have been discussed with the patient and they elect to proceed with surgery. There are no active contraindications to surgery such as ongoing infection or rapidly progressive neurological disease.  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 Treated with Coumadin  Allergies No Known Drug Allergies  Family History Heart Disease Maternal Grandmother. First Degree Relatives reported Heart disease in male family member before age 65  Social History Living situation live with partner Exercise Exercises rarely; does running / walking Marital status divorced Tobacco / smoke exposure 05/01/2014: yes Number of flights of stairs before winded 2-3 Current drinker 05/01/2014: Currently drinks wine 5-7 times per week Not under pain contract No history of drug/alcohol rehab Tobacco use Current some day smoker. 05/01/2014: smoke(d) 3 or more pack(s) per day Children 1 Current work status retired  Medication History Tylenol Extra Strength (500MG Tablet, Oral as needed) Active. Nifedical XL (30MG Tablet ER 24HR, Oral) Active. Tumeric Active.  Past Surgical History Bilateral Hip Pinning for SCFE's Inguinal Hernia Repair open: bilateral Total Hip Replacement - Right  Review of Systems  General Not Present- Chills, Fatigue, Fever, Memory Loss, Night Sweats, Weight Gain and Weight Loss. Skin Not Present- Eczema, Hives, Itching, Lesions and Rash. HEENT Not Present- Dentures, Double Vision, Headache, Hearing Loss, Tinnitus and Visual Loss. Respiratory Not Present- Allergies, Chronic Cough, Coughing up blood, Shortness of breath at rest and Shortness of breath with exertion. Cardiovascular Not Present- Chest Pain, Difficulty Breathing Lying Down, Murmur, Palpitations, Racing/skipping heartbeats and Swelling. Gastrointestinal Not Present- Abdominal Pain, Bloody Stool, Constipation, Diarrhea, Difficulty Swallowing, Heartburn, Jaundice, Loss of appetitie, Nausea and Vomiting. Male Genitourinary Not Present- Blood in Urine, Discharge, Flank Pain, Incontinence, Painful Urination, Urgency, Urinary frequency, Urinary Retention, Urinating at Night and Weak urinary  stream. Musculoskeletal Present- Morning Stiffness and Muscle Pain. Not Present- Back Pain, Joint   Pain, Joint Swelling, Muscle Weakness and Spasms. Neurological Not Present- Blackout spells, Difficulty with balance, Dizziness, Paralysis, Tremor and Weakness. Psychiatric Not Present- Insomnia.   Vitals Weight: 221 lb Height: 71in Body Surface Area: 2.2 m Body Mass Index: 30.82 kg/m  BP: 130/78 (Sitting, Right Arm, Standard)   Physical Exam General Mental Status -Alert, cooperative and good historian. General Appearance-pleasant, Not in acute distress. Orientation-Oriented X3. Build & Nutrition-Well nourished and Well developed. Gait-Antalgic and Limping.  Head and Neck Head-normocephalic, atraumatic . Neck Global Assessment - supple, no bruit auscultated on the right, no bruit auscultated on the left.  Eye Pupil - Bilateral-Regular and Round. Motion - Bilateral-EOMI.  Chest and Lung Exam Auscultation Breath sounds - clear at anterior chest wall and clear at posterior chest wall. Adventitious sounds - No Adventitious sounds.  Cardiovascular Auscultation Rhythm - Regular rate and rhythm. Heart Sounds - S1 WNL and S2 WNL. Murmurs & Other Heart Sounds - Auscultation of the heart reveals - No Murmurs.  Abdomen Palpation/Percussion Tenderness - Abdomen is non-tender to palpation. Rigidity (guarding) - Abdomen is soft. Auscultation Auscultation of the abdomen reveals - Bowel sounds normal.  Male Genitourinary Note: Not done, not pertinent to present illness   Musculoskeletal Note: He is a well developed male. He is alert and oriented. No apparent distress. The left hip flexion is to about 90, about 5-10 external rotation and about 10 degrees of abduction. The knee exams are normal. Pulse, sensation and motor are intact in both lower extremities. He has a very stiff legged antalgic gait pattern.  RADIOGRAPHS: AP pelvis and lateral of hip shows  severe end stage arthritic change left hip. On the left he also has severe bone on bone arthritis with subchondral cystic formation but less deformity.   Assessment & Plan Osteoarthritis of left hip (M16.12) Note:Plan is for a Left Total Hip Replacement - anterior approach by Dr. Aluisio.  Plan is to go home.  PCP - Dr. S. Hilton - Patient has been seen preoperatively and felt to be stable for surgery.  Topical TXA - History of Blood Clot  Signed electronically by Alexzandrew L Perkins, III PA-C 

## 2014-12-26 ENCOUNTER — Inpatient Hospital Stay (HOSPITAL_COMMUNITY): Payer: Medicare PPO

## 2014-12-26 ENCOUNTER — Inpatient Hospital Stay (HOSPITAL_COMMUNITY): Payer: Medicare PPO | Admitting: Anesthesiology

## 2014-12-26 ENCOUNTER — Encounter (HOSPITAL_COMMUNITY): Payer: Self-pay | Admitting: *Deleted

## 2014-12-26 ENCOUNTER — Inpatient Hospital Stay (HOSPITAL_COMMUNITY)
Admission: RE | Admit: 2014-12-26 | Discharge: 2014-12-28 | DRG: 470 | Disposition: A | Discharge status: Home Health | Payer: Medicare PPO | Source: Ambulatory Visit | Attending: Orthopedic Surgery | Admitting: Orthopedic Surgery

## 2014-12-26 ENCOUNTER — Encounter (HOSPITAL_COMMUNITY)
Admission: RE | Disposition: A | Discharge status: Home Health | Payer: Self-pay | Source: Ambulatory Visit | Attending: Orthopedic Surgery

## 2014-12-26 DIAGNOSIS — Z96649 Presence of unspecified artificial hip joint: Secondary | ICD-10-CM

## 2014-12-26 DIAGNOSIS — K219 Gastro-esophageal reflux disease without esophagitis: Secondary | ICD-10-CM | POA: Diagnosis present

## 2014-12-26 DIAGNOSIS — M169 Osteoarthritis of hip, unspecified: Secondary | ICD-10-CM | POA: Diagnosis present

## 2014-12-26 DIAGNOSIS — Z8249 Family history of ischemic heart disease and other diseases of the circulatory system: Secondary | ICD-10-CM | POA: Diagnosis not present

## 2014-12-26 DIAGNOSIS — I10 Essential (primary) hypertension: Secondary | ICD-10-CM | POA: Diagnosis present

## 2014-12-26 DIAGNOSIS — M1612 Unilateral primary osteoarthritis, left hip: Principal | ICD-10-CM | POA: Diagnosis present

## 2014-12-26 DIAGNOSIS — Z96641 Presence of right artificial hip joint: Secondary | ICD-10-CM | POA: Diagnosis present

## 2014-12-26 DIAGNOSIS — F17210 Nicotine dependence, cigarettes, uncomplicated: Secondary | ICD-10-CM | POA: Diagnosis present

## 2014-12-26 DIAGNOSIS — Z01812 Encounter for preprocedural laboratory examination: Secondary | ICD-10-CM | POA: Diagnosis not present

## 2014-12-26 DIAGNOSIS — Z79899 Other long term (current) drug therapy: Secondary | ICD-10-CM

## 2014-12-26 DIAGNOSIS — M25752 Osteophyte, left hip: Secondary | ICD-10-CM | POA: Diagnosis present

## 2014-12-26 DIAGNOSIS — M25552 Pain in left hip: Secondary | ICD-10-CM | POA: Diagnosis present

## 2014-12-26 HISTORY — PX: TOTAL HIP ARTHROPLASTY: SHX124

## 2014-12-26 LAB — URINALYSIS, ROUTINE W REFLEX MICROSCOPIC
Glucose, UA: NEGATIVE mg/dL
HGB URINE DIPSTICK: NEGATIVE
Ketones, ur: NEGATIVE mg/dL
Leukocytes, UA: NEGATIVE
Nitrite: NEGATIVE
Protein, ur: NEGATIVE mg/dL
SPECIFIC GRAVITY, URINE: 1.023 (ref 1.005–1.030)
Urobilinogen, UA: 0.2 mg/dL (ref 0.0–1.0)
pH: 5 (ref 5.0–8.0)

## 2014-12-26 LAB — TYPE AND SCREEN
ABO/RH(D): O POS
ANTIBODY SCREEN: NEGATIVE

## 2014-12-26 SURGERY — ARTHROPLASTY, HIP, TOTAL, ANTERIOR APPROACH
Anesthesia: General | Site: Hip | Laterality: Left

## 2014-12-26 MED ORDER — ONDANSETRON HCL 4 MG/2ML IJ SOLN: 4.0000 mg | Freq: Four times a day (QID) | INTRAMUSCULAR | Status: DC | PRN

## 2014-12-26 MED ORDER — ONDANSETRON HCL 4 MG/2ML IJ SOLN
INTRAMUSCULAR | Status: DC | PRN
  Administered 2014-12-26: 4 mg via INTRAVENOUS

## 2014-12-26 MED ORDER — BISACODYL 10 MG RE SUPP: 10.0000 mg | Freq: Every day | RECTAL | Status: DC | PRN

## 2014-12-26 MED ORDER — TRANEXAMIC ACID 100 MG/ML IV SOLN
2000.0000 mg | Freq: Once | INTRAVENOUS | Status: DC
  Filled 2014-12-26: qty 20

## 2014-12-26 MED ORDER — BUPIVACAINE HCL (PF) 0.25 % IJ SOLN
INTRAMUSCULAR | Status: AC
  Filled 2014-12-26: qty 30

## 2014-12-26 MED ORDER — TRAZODONE HCL 50 MG PO TABS: 50.0000 mg | ORAL_TABLET | Freq: Every evening | ORAL | Status: DC | PRN

## 2014-12-26 MED ORDER — NEOSTIGMINE METHYLSULFATE 10 MG/10ML IV SOLN
INTRAVENOUS | Status: DC | PRN
Start: 2014-12-26 — End: 2014-12-26
  Administered 2014-12-26: 5 mg via INTRAVENOUS

## 2014-12-26 MED ORDER — KETOROLAC TROMETHAMINE 15 MG/ML IJ SOLN: 7.5000 mg | Freq: Four times a day (QID) | INTRAMUSCULAR | Status: AC | PRN

## 2014-12-26 MED ORDER — MENTHOL 3 MG MT LOZG
1.0000 | LOZENGE | OROMUCOSAL | Status: DC | PRN
  Filled 2014-12-26: qty 9

## 2014-12-26 MED ORDER — MORPHINE SULFATE 2 MG/ML IJ SOLN: 1.0000 mg | INTRAMUSCULAR | Status: DC | PRN

## 2014-12-26 MED ORDER — CEFAZOLIN SODIUM-DEXTROSE 2-3 GM-% IV SOLR
2.0000 g | INTRAVENOUS | Status: AC
  Administered 2014-12-26: 2 g via INTRAVENOUS

## 2014-12-26 MED ORDER — CEFAZOLIN SODIUM-DEXTROSE 2-3 GM-% IV SOLR
2.0000 g | Freq: Four times a day (QID) | INTRAVENOUS | Status: AC
  Administered 2014-12-26 – 2014-12-27 (×2): 2 g via INTRAVENOUS
  Filled 2014-12-26 (×2): qty 50

## 2014-12-26 MED ORDER — SODIUM CHLORIDE 0.9 % IV SOLN: INTRAVENOUS | Status: DC

## 2014-12-26 MED ORDER — CEFAZOLIN SODIUM-DEXTROSE 2-3 GM-% IV SOLR
INTRAVENOUS | Status: AC
  Filled 2014-12-26: qty 50

## 2014-12-26 MED ORDER — TRAMADOL HCL 50 MG PO TABS: 50.0000 mg | ORAL_TABLET | Freq: Four times a day (QID) | ORAL | Status: DC | PRN

## 2014-12-26 MED ORDER — ACETAMINOPHEN 650 MG RE SUPP: 650.0000 mg | Freq: Four times a day (QID) | RECTAL | Status: DC | PRN

## 2014-12-26 MED ORDER — METHOCARBAMOL 1000 MG/10ML IJ SOLN
500.0000 mg | Freq: Four times a day (QID) | INTRAMUSCULAR | Status: DC | PRN
  Filled 2014-12-26: qty 5

## 2014-12-26 MED ORDER — BUPIVACAINE LIPOSOME 1.3 % IJ SUSP
20.0000 mL | Freq: Once | INTRAMUSCULAR | Status: DC
  Filled 2014-12-26: qty 20

## 2014-12-26 MED ORDER — METOCLOPRAMIDE HCL 5 MG/ML IJ SOLN: 5.0000 mg | Freq: Three times a day (TID) | INTRAMUSCULAR | Status: DC | PRN

## 2014-12-26 MED ORDER — PHENYLEPHRINE HCL 10 MG/ML IJ SOLN
INTRAMUSCULAR | Status: DC | PRN
  Administered 2014-12-26 (×3): 80 ug via INTRAVENOUS

## 2014-12-26 MED ORDER — PROPOFOL 10 MG/ML IV BOLUS
INTRAVENOUS | Status: DC | PRN
  Administered 2014-12-26: 200 mg via INTRAVENOUS

## 2014-12-26 MED ORDER — BUPIVACAINE HCL (PF) 0.25 % IJ SOLN
INTRAMUSCULAR | Status: DC | PRN
  Administered 2014-12-26: 30 mL

## 2014-12-26 MED ORDER — METHOCARBAMOL 500 MG PO TABS
500.0000 mg | ORAL_TABLET | Freq: Four times a day (QID) | ORAL | Status: DC | PRN
  Administered 2014-12-26 – 2014-12-28 (×7): 500 mg via ORAL
  Filled 2014-12-26 (×7): qty 1

## 2014-12-26 MED ORDER — OXYCODONE HCL 5 MG/5ML PO SOLN
5.0000 mg | Freq: Once | ORAL | Status: DC | PRN
  Filled 2014-12-26: qty 5

## 2014-12-26 MED ORDER — ACETAMINOPHEN 500 MG PO TABS
1000.0000 mg | ORAL_TABLET | Freq: Four times a day (QID) | ORAL | Status: AC
Start: 2014-12-26 — End: 2014-12-27
  Administered 2014-12-26 – 2014-12-27 (×4): 1000 mg via ORAL
  Filled 2014-12-26 (×5): qty 2

## 2014-12-26 MED ORDER — DEXAMETHASONE SODIUM PHOSPHATE 10 MG/ML IJ SOLN
10.0000 mg | Freq: Once | INTRAMUSCULAR | Status: AC
  Administered 2014-12-26: 10 mg via INTRAVENOUS

## 2014-12-26 MED ORDER — LACTATED RINGERS IV SOLN
INTRAVENOUS | Status: DC | PRN
  Administered 2014-12-26 (×3): via INTRAVENOUS

## 2014-12-26 MED ORDER — FENTANYL CITRATE 0.05 MG/ML IJ SOLN
INTRAMUSCULAR | Status: AC
  Filled 2014-12-26: qty 5

## 2014-12-26 MED ORDER — DEXAMETHASONE SODIUM PHOSPHATE 10 MG/ML IJ SOLN
10.0000 mg | Freq: Once | INTRAMUSCULAR | Status: AC
  Administered 2014-12-27: 10 mg via INTRAVENOUS
  Filled 2014-12-26: qty 1

## 2014-12-26 MED ORDER — ACETAMINOPHEN 10 MG/ML IV SOLN
1000.0000 mg | Freq: Once | INTRAVENOUS | Status: AC
  Administered 2014-12-26: 1000 mg via INTRAVENOUS
  Filled 2014-12-26: qty 100

## 2014-12-26 MED ORDER — HYDROMORPHONE HCL 2 MG/ML IJ SOLN
INTRAMUSCULAR | Status: AC
  Filled 2014-12-26: qty 1

## 2014-12-26 MED ORDER — PHENYLEPHRINE 40 MCG/ML (10ML) SYRINGE FOR IV PUSH (FOR BLOOD PRESSURE SUPPORT)
PREFILLED_SYRINGE | INTRAVENOUS | Status: AC
  Filled 2014-12-26: qty 10

## 2014-12-26 MED ORDER — ONDANSETRON HCL 4 MG PO TABS: 4.0000 mg | ORAL_TABLET | Freq: Four times a day (QID) | ORAL | Status: DC | PRN

## 2014-12-26 MED ORDER — POLYETHYLENE GLYCOL 3350 17 G PO PACK: 17.0000 g | PACK | Freq: Every day | ORAL | Status: DC | PRN

## 2014-12-26 MED ORDER — DIPHENHYDRAMINE HCL 12.5 MG/5ML PO ELIX: 12.5000 mg | ORAL_SOLUTION | ORAL | Status: DC | PRN

## 2014-12-26 MED ORDER — ROCURONIUM BROMIDE 100 MG/10ML IV SOLN
INTRAVENOUS | Status: AC
  Filled 2014-12-26: qty 1

## 2014-12-26 MED ORDER — HYDROMORPHONE HCL 1 MG/ML IJ SOLN
INTRAMUSCULAR | Status: DC | PRN
  Administered 2014-12-26 (×3): 1 mg via INTRAVENOUS

## 2014-12-26 MED ORDER — NIFEDIPINE ER 30 MG PO TB24
30.0000 mg | ORAL_TABLET | Freq: Every morning | ORAL | Status: DC
  Administered 2014-12-27 – 2014-12-28 (×2): 30 mg via ORAL
  Filled 2014-12-26 (×2): qty 1

## 2014-12-26 MED ORDER — METOCLOPRAMIDE HCL 10 MG PO TABS: 5.0000 mg | ORAL_TABLET | Freq: Three times a day (TID) | ORAL | Status: DC | PRN

## 2014-12-26 MED ORDER — ONDANSETRON HCL 4 MG/2ML IJ SOLN
INTRAMUSCULAR | Status: AC
  Filled 2014-12-26: qty 2

## 2014-12-26 MED ORDER — PHENOL 1.4 % MT LIQD: 1.0000 | OROMUCOSAL | Status: DC | PRN

## 2014-12-26 MED ORDER — MIDAZOLAM HCL 5 MG/5ML IJ SOLN
INTRAMUSCULAR | Status: DC | PRN
  Administered 2014-12-26: 2 mg via INTRAVENOUS

## 2014-12-26 MED ORDER — SODIUM CHLORIDE 0.9 % IJ SOLN
INTRAMUSCULAR | Status: DC | PRN
  Administered 2014-12-26: 10 mL

## 2014-12-26 MED ORDER — BUPIVACAINE LIPOSOME 1.3 % IJ SUSP
INTRAMUSCULAR | Status: DC | PRN
  Administered 2014-12-26: 20 mL

## 2014-12-26 MED ORDER — LACTATED RINGERS IV SOLN
INTRAVENOUS | Status: DC
  Administered 2014-12-26: 1000 mL via INTRAVENOUS

## 2014-12-26 MED ORDER — KCL IN DEXTROSE-NACL 20-5-0.9 MEQ/L-%-% IV SOLN
INTRAVENOUS | Status: DC
  Administered 2014-12-26: 100 mL/h via INTRAVENOUS
  Filled 2014-12-26 (×3): qty 1000

## 2014-12-26 MED ORDER — ACETAMINOPHEN 325 MG PO TABS: 650.0000 mg | ORAL_TABLET | Freq: Four times a day (QID) | ORAL | Status: DC | PRN

## 2014-12-26 MED ORDER — ROCURONIUM BROMIDE 100 MG/10ML IV SOLN
INTRAVENOUS | Status: DC | PRN
  Administered 2014-12-26: 50 mg via INTRAVENOUS

## 2014-12-26 MED ORDER — GLYCOPYRROLATE 0.2 MG/ML IJ SOLN
INTRAMUSCULAR | Status: DC | PRN
  Administered 2014-12-26: 0.6 mg via INTRAVENOUS

## 2014-12-26 MED ORDER — 0.9 % SODIUM CHLORIDE (POUR BTL) OPTIME
TOPICAL | Status: DC | PRN
  Administered 2014-12-26: 1000 mL

## 2014-12-26 MED ORDER — FLEET ENEMA 7-19 GM/118ML RE ENEM: 1.0000 | ENEMA | Freq: Once | RECTAL | Status: AC | PRN

## 2014-12-26 MED ORDER — STERILE WATER FOR IRRIGATION IR SOLN
Status: DC | PRN
  Administered 2014-12-26: 1500 mL

## 2014-12-26 MED ORDER — MIDAZOLAM HCL 2 MG/2ML IJ SOLN
INTRAMUSCULAR | Status: AC
  Filled 2014-12-26: qty 2

## 2014-12-26 MED ORDER — DOCUSATE SODIUM 100 MG PO CAPS
100.0000 mg | ORAL_CAPSULE | Freq: Two times a day (BID) | ORAL | Status: DC
  Administered 2014-12-26 – 2014-12-28 (×4): 100 mg via ORAL

## 2014-12-26 MED ORDER — SODIUM CHLORIDE 0.9 % IJ SOLN
INTRAMUSCULAR | Status: AC
  Filled 2014-12-26: qty 50

## 2014-12-26 MED ORDER — HYDROMORPHONE HCL 1 MG/ML IJ SOLN: 0.2500 mg | INTRAMUSCULAR | Status: DC | PRN

## 2014-12-26 MED ORDER — LIDOCAINE HCL (CARDIAC) 20 MG/ML IV SOLN
INTRAVENOUS | Status: DC | PRN
  Administered 2014-12-26: 100 mg via INTRAVENOUS

## 2014-12-26 MED ORDER — RIVAROXABAN 10 MG PO TABS
10.0000 mg | ORAL_TABLET | Freq: Every day | ORAL | Status: DC
  Administered 2014-12-27 – 2014-12-28 (×2): 10 mg via ORAL
  Filled 2014-12-26 (×3): qty 1

## 2014-12-26 MED ORDER — PROMETHAZINE HCL 25 MG/ML IJ SOLN: 6.2500 mg | INTRAMUSCULAR | Status: DC | PRN

## 2014-12-26 MED ORDER — PROPOFOL 10 MG/ML IV BOLUS
INTRAVENOUS | Status: AC
  Filled 2014-12-26: qty 20

## 2014-12-26 MED ORDER — FENTANYL CITRATE 0.05 MG/ML IJ SOLN
INTRAMUSCULAR | Status: DC | PRN
  Administered 2014-12-26 (×2): 100 ug via INTRAVENOUS
  Administered 2014-12-26: 50 ug via INTRAVENOUS

## 2014-12-26 MED ORDER — CHLORHEXIDINE GLUCONATE 4 % EX LIQD: 60.0000 mL | Freq: Once | CUTANEOUS | Status: DC

## 2014-12-26 MED ORDER — OXYCODONE HCL 5 MG PO TABS
5.0000 mg | ORAL_TABLET | ORAL | Status: DC | PRN
  Administered 2014-12-26: 10 mg via ORAL
  Administered 2014-12-26: 5 mg via ORAL
  Administered 2014-12-26 – 2014-12-28 (×9): 10 mg via ORAL
  Filled 2014-12-26 (×7): qty 2
  Filled 2014-12-26: qty 1
  Filled 2014-12-26 (×3): qty 2

## 2014-12-26 MED ORDER — OXYCODONE HCL 5 MG PO TABS: 5.0000 mg | ORAL_TABLET | Freq: Once | ORAL | Status: DC | PRN

## 2014-12-26 SURGICAL SUPPLY — 40 items
BAG ZIPLOCK 12X15 (MISCELLANEOUS) IMPLANT
BLADE EXTENDED COATED 6.5IN (ELECTRODE) ×3 IMPLANT
BLADE SAG 18X100X1.27 (BLADE) ×3 IMPLANT
CAPT HIP TOTAL 2 ×3 IMPLANT
CLOSURE STERI-STRIP 1/4X4 (GAUZE/BANDAGES/DRESSINGS) ×3 IMPLANT
CLOSURE WOUND 1/2 X4 (GAUZE/BANDAGES/DRESSINGS) ×1
COVER PERINEAL POST (MISCELLANEOUS) ×3 IMPLANT
DECANTER SPIKE VIAL GLASS SM (MISCELLANEOUS) ×3 IMPLANT
DRAPE C-ARM 42X120 X-RAY (DRAPES) ×3 IMPLANT
DRAPE STERI IOBAN 125X83 (DRAPES) ×3 IMPLANT
DRAPE U-SHAPE 47X51 STRL (DRAPES) ×9 IMPLANT
DRSG ADAPTIC 3X8 NADH LF (GAUZE/BANDAGES/DRESSINGS) ×3 IMPLANT
DRSG MEPILEX BORDER 4X4 (GAUZE/BANDAGES/DRESSINGS) ×3 IMPLANT
DRSG MEPILEX BORDER 4X8 (GAUZE/BANDAGES/DRESSINGS) ×3 IMPLANT
DURAPREP 26ML APPLICATOR (WOUND CARE) ×3 IMPLANT
ELECT REM PT RETURN 9FT ADLT (ELECTROSURGICAL) ×3
ELECTRODE REM PT RTRN 9FT ADLT (ELECTROSURGICAL) ×1 IMPLANT
EVACUATOR 1/8 PVC DRAIN (DRAIN) ×3 IMPLANT
FACESHIELD WRAPAROUND (MASK) ×12 IMPLANT
GLOVE BIO SURGEON STRL SZ7.5 (GLOVE) ×3 IMPLANT
GLOVE BIO SURGEON STRL SZ8 (GLOVE) ×6 IMPLANT
GLOVE BIOGEL PI IND STRL 8 (GLOVE) ×2 IMPLANT
GLOVE BIOGEL PI INDICATOR 8 (GLOVE) ×4
GOWN STRL REUS W/TWL LRG LVL3 (GOWN DISPOSABLE) ×3 IMPLANT
GOWN STRL REUS W/TWL XL LVL3 (GOWN DISPOSABLE) ×3 IMPLANT
KIT BASIN OR (CUSTOM PROCEDURE TRAY) ×3 IMPLANT
NDL SAFETY ECLIPSE 18X1.5 (NEEDLE) ×2 IMPLANT
NEEDLE HYPO 18GX1.5 SHARP (NEEDLE) ×4
PACK TOTAL JOINT (CUSTOM PROCEDURE TRAY) ×3 IMPLANT
STRIP CLOSURE SKIN 1/2X4 (GAUZE/BANDAGES/DRESSINGS) ×2 IMPLANT
SUT ETHIBOND NAB CT1 #1 30IN (SUTURE) ×3 IMPLANT
SUT MNCRL AB 4-0 PS2 18 (SUTURE) ×3 IMPLANT
SUT VIC AB 2-0 CT1 27 (SUTURE) ×4
SUT VIC AB 2-0 CT1 TAPERPNT 27 (SUTURE) ×2 IMPLANT
SUT VLOC 180 0 24IN GS25 (SUTURE) ×3 IMPLANT
SYR 20CC LL (SYRINGE) ×3 IMPLANT
SYR 50ML LL SCALE MARK (SYRINGE) ×3 IMPLANT
TOWEL OR 17X26 10 PK STRL BLUE (TOWEL DISPOSABLE) ×3 IMPLANT
TOWEL OR NON WOVEN STRL DISP B (DISPOSABLE) IMPLANT
TRAY FOLEY CATH 14FRSI W/METER (CATHETERS) ×3 IMPLANT

## 2014-12-26 NOTE — Transfer of Care (Signed)
Immediate Anesthesia Transfer of Care Note  Patient: Cody Morales  Procedure(s) Performed: Procedure(s) (LRB): LEFT TOTAL HIP ARTHROPLASTY ANTERIOR APPROACH (Left)  Patient Location: PACU  Anesthesia Type: General  Level of Consciousness: sedated, patient cooperative and responds to stimulation  Airway & Oxygen Therapy: Patient Spontanous Breathing and Patient connected to face mask oxgen  Post-op Assessment: Report given to PACU RN and Post -op Vital signs reviewed and stable  Post vital signs: Reviewed and stable  Complications: No apparent anesthesia complications

## 2014-12-26 NOTE — Anesthesia Preprocedure Evaluation (Addendum)
Anesthesia Evaluation  Patient identified by MRN, date of birth, ID band Patient awake    Reviewed: Allergy & Precautions, NPO status , Patient's Chart, lab work & pertinent test results  History of Anesthesia Complications Negative for: history of anesthetic complications  Airway Mallampati: II  TM Distance: >3 FB Neck ROM: Full    Dental  (+) Teeth Intact, Dental Advisory Given   Pulmonary Current Smoker,    Pulmonary exam normal       Cardiovascular hypertension, Pt. on medications     Neuro/Psych negative neurological ROS  negative psych ROS   GI/Hepatic Neg liver ROS, GERD-  ,  Endo/Other  negative endocrine ROS  Renal/GU negative Renal ROS     Musculoskeletal   Abdominal   Peds  Hematology   Anesthesia Other Findings   Reproductive/Obstetrics                           Anesthesia Physical Anesthesia Plan  ASA: II  Anesthesia Plan: General   Post-op Pain Management:    Induction: Intravenous and Rapid sequence  Airway Management Planned: Oral ETT  Additional Equipment:   Intra-op Plan:   Post-operative Plan: Extubation in OR  Informed Consent: I have reviewed the patients History and Physical, chart, labs and discussed the procedure including the risks, benefits and alternatives for the proposed anesthesia with the patient or authorized representative who has indicated his/her understanding and acceptance.   Dental advisory given  Plan Discussed with: CRNA, Anesthesiologist and Surgeon  Anesthesia Plan Comments:        Anesthesia Quick Evaluation

## 2014-12-26 NOTE — H&P (View-Only) (Signed)
Cody Morales DOB: May 17, 1956 Divorced / Language: English / Race: Black or African American Male Date of Admission: 12/26/2014 CC: Left Hip Pain History of Present Illness The patient is a 59 year old male who comes in for a preoperative History and Physical. The patient is scheduled for a left total hip arthroplasty (anterior approach) to be performed by Dr. Gus RankinFrank V. Aluisio, [facility] on 12-26-2014. The patient comes in now several months out from right total hip arthroplasty. The patient states that he is doing very well at this time. The pain is under excellent control at this time and describes their pain as mild. They are currently on Tylenol (occasionally) and Ultram for their pain. The patient is currently doing home exercise program. The patient feels that they are progressing well at this time. Note for "Post operative hip": His left hip is killing him. It continues to impact his mobility. He is ready for surgery. He is really pleased with how his right hip is doing. He said now that the right hip is feeling fine, the left hip is hurting more noticeably. This is definitely what is limiting him at this time. He is ready to proceed with the left hip at this time. They have been treated conservatively in the past for the above stated problem and despite conservative measures, they continue to have progressive pain and severe functional limitations and dysfunction. They have failed non-operative management including home exercise, medications. It is felt that they would benefit from undergoing total joint replacement. Risks and benefits of the procedure have been discussed with the patient and they elect to proceed with surgery. There are no active contraindications to surgery such as ongoing infection or rapidly progressive neurological disease.  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 Treated with Coumadin  Allergies No Known Drug Allergies  Family History Heart Disease Maternal Grandmother. First Degree Relatives reported Heart disease in male family member before age 58  Social History Living situation live with partner Exercise Exercises rarely; does running / walking Marital status divorced Tobacco / smoke exposure 05/01/2014: yes Number of flights of stairs before winded 2-3 Current drinker 05/01/2014: Currently drinks wine 5-7 times per week Not under pain contract No history of drug/alcohol rehab Tobacco use Current some day smoker. 05/01/2014: smoke(d) 3 or more pack(s) per day Children 1 Current work status retired  Medication History Tylenol Extra Strength (500MG  Tablet, Oral as needed) Active. Nifedical XL (30MG  Tablet ER 24HR, Oral) Active. Tumeric Active.  Past Surgical History Bilateral Hip Pinning for SCFE's Inguinal Hernia Repair open: bilateral Total Hip Replacement - Right  Review of Systems  General Not Present- Chills, Fatigue, Fever, Memory Loss, Night Sweats, Weight Gain and Weight Loss. Skin Not Present- Eczema, Hives, Itching, Lesions and Rash. HEENT Not Present- Dentures, Double Vision, Headache, Hearing Loss, Tinnitus and Visual Loss. Respiratory Not Present- Allergies, Chronic Cough, Coughing up blood, Shortness of breath at rest and Shortness of breath with exertion. Cardiovascular Not Present- Chest Pain, Difficulty Breathing Lying Down, Murmur, Palpitations, Racing/skipping heartbeats and Swelling. Gastrointestinal Not Present- Abdominal Pain, Bloody Stool, Constipation, Diarrhea, Difficulty Swallowing, Heartburn, Jaundice, Loss of appetitie, Nausea and Vomiting. Male Genitourinary Not Present- Blood in Urine, Discharge, Flank Pain, Incontinence, Painful Urination, Urgency, Urinary frequency, Urinary Retention, Urinating at Night and Weak urinary  stream. Musculoskeletal Present- Morning Stiffness and Muscle Pain. Not Present- Back Pain, Joint  Pain, Joint Swelling, Muscle Weakness and Spasms. Neurological Not Present- Blackout spells, Difficulty with balance, Dizziness, Paralysis, Tremor and Weakness. Psychiatric Not Present- Insomnia.   Vitals Weight: 221 lb Height: 71in Body Surface Area: 2.2 m Body Mass Index: 30.82 kg/m  BP: 130/78 (Sitting, Right Arm, Standard)   Physical Exam General Mental Status -Alert, cooperative and good historian. General Appearance-pleasant, Not in acute distress. Orientation-Oriented X3. Build & Nutrition-Well nourished and Well developed. Gait-Antalgic and Limping.  Head and Neck Head-normocephalic, atraumatic . Neck Global Assessment - supple, no bruit auscultated on the right, no bruit auscultated on the left.  Eye Pupil - Bilateral-Regular and Round. Motion - Bilateral-EOMI.  Chest and Lung Exam Auscultation Breath sounds - clear at anterior chest wall and clear at posterior chest wall. Adventitious sounds - No Adventitious sounds.  Cardiovascular Auscultation Rhythm - Regular rate and rhythm. Heart Sounds - S1 WNL and S2 WNL. Murmurs & Other Heart Sounds - Auscultation of the heart reveals - No Murmurs.  Abdomen Palpation/Percussion Tenderness - Abdomen is non-tender to palpation. Rigidity (guarding) - Abdomen is soft. Auscultation Auscultation of the abdomen reveals - Bowel sounds normal.  Male Genitourinary Note: Not done, not pertinent to present illness   Musculoskeletal Note: He is a well developed male. He is alert and oriented. No apparent distress. The left hip flexion is to about 90, about 5-10 external rotation and about 10 degrees of abduction. The knee exams are normal. Pulse, sensation and motor are intact in both lower extremities. He has a very stiff legged antalgic gait pattern.  RADIOGRAPHS: AP pelvis and lateral of hip shows  severe end stage arthritic change left hip. On the left he also has severe bone on bone arthritis with subchondral cystic formation but less deformity.   Assessment & Plan Osteoarthritis of left hip (M16.12) Note:Plan is for a Left Total Hip Replacement - anterior approach by Dr. Lequita Halt.  Plan is to go home.  PCP - Dr. Cristina Gong - Patient has been seen preoperatively and felt to be stable for surgery.  Topical TXA - History of Blood Clot  Signed electronically by Lauraine Rinne, III PA-C

## 2014-12-26 NOTE — Interval H&P Note (Signed)
History and Physical Interval Note:  12/26/2014 12:58 PM  Cody Morales  has presented today for surgery, with the diagnosis of OA LEFT HIP  The various methods of treatment have been discussed with the patient and family. After consideration of risks, benefits and other options for treatment, the patient has consented to  Procedure(s): LEFT TOTAL HIP ARTHROPLASTY ANTERIOR APPROACH (Left) as a surgical intervention .  The patient's history has been reviewed, patient examined, no change in status, stable for surgery.  I have reviewed the patient's chart and labs.  Questions were answered to the patient's satisfaction.     Loanne DrillingALUISIO,Makinna Andy V

## 2014-12-26 NOTE — Progress Notes (Signed)
Scheduled procedure is on Medicare IP only list.  Will need order for INPATIENT post-op. Thanks you  

## 2014-12-26 NOTE — Anesthesia Postprocedure Evaluation (Signed)
Anesthesia Post Note  Patient: Cody Morales  Procedure(s) Performed: Procedure(s) (LRB): LEFT TOTAL HIP ARTHROPLASTY ANTERIOR APPROACH (Left)  Anesthesia type: general  Patient location: PACU  Post pain: Pain level controlled  Post assessment: Patient's Cardiovascular Status Stable  Last Vitals:  Filed Vitals:   12/26/14 1703  BP: 129/93  Pulse: 102  Temp: 37.1 C  Resp: 15    Post vital signs: Reviewed and stable  Level of consciousness: sedated  Complications: No apparent anesthesia complications

## 2014-12-26 NOTE — Op Note (Signed)
OPERATIVE REPORT  PREOPERATIVE DIAGNOSIS: Osteoarthritis of the Left hip.   POSTOPERATIVE DIAGNOSIS: Osteoarthritis of the Left  hip.   PROCEDURE: Left total hip arthroplasty, anterior approach.   SURGEON: Ollen Gross, MD   ASSISTANT: Avel Peace, PA-C  ANESTHESIA:  General  ESTIMATED BLOOD LOSS:- 600 ml   DRAINS: Hemovac x1.   COMPLICATIONS: None   CONDITION: PACU - hemodynamically stable.   BRIEF CLINICAL NOTE: Cody Morales is a 59 y.o. male who has advanced end-  stage arthritis of his Left  hip with progressively worsening pain and  dysfunction.The patient has failed nonoperative management and presents for  total hip arthroplasty.   PROCEDURE IN DETAIL: After successful administration of spinal  anesthetic, the traction boots for the [facility] bed were placed on both  feet and the patient was placed onto the [facility] bed, boots placed into the leg  holders. The Left hip was then isolated from the perineum with plastic  drapes and prepped and draped in the usual sterile fashion. ASIS and  greater trochanter were marked and a oblique incision was made, starting  at about 1 cm lateral and 2 cm distal to the ASIS and coursing towards  the anterior cortex of the femur. The skin was cut with a 10 blade  through subcutaneous tissue to the level of the fascia overlying the  tensor fascia lata muscle. The fascia was then incised in line with the  incision at the junction of the anterior third and posterior 2/3rd. The  muscle was teased off the fascia and then the interval between the TFL  and the rectus was developed. The Hohmann retractor was then placed at  the top of the femoral neck over the capsule. The vessels overlying the  capsule were cauterized and the fat on top of the capsule was removed.  A Hohmann retractor was then placed anterior underneath the rectus  femoris to give exposure to the entire anterior capsule. A T-shaped  capsulotomy was performed. The  edges were tagged and the femoral head  was identified.       Osteophytes are removed off the superior acetabulum.  The femoral neck was then cut in situ with an oscillating saw. Traction  was then applied to the left lower extremity utilizing the [facility]  traction. The femoral head was then removed. Retractors were placed  around the acetabulum and then circumferential removal of the labrum was  performed. Osteophytes were also removed. Reaming starts at 47 mm to  medialize and  Increased in 2 mm increments to 51 mm. We reamed in  approximately 40 degrees of abduction, 20 degrees anteversion. A 52 mm  pinnacle acetabular shell was then impacted in anatomic position under  fluoroscopic guidance with excellent purchase. We did not need to place  any additional dome screws. A 36 mm neutral + 4 marathon liner was then  placed into the acetabular shell.       The femoral lift was then placed along the lateral aspect of the femur  just distal to the vastus ridge. The leg was  externally rotated and capsule  was stripped off the inferior aspect of the femoral neck down to the  level of the lesser trochanter, this was done with electrocautery. The femur was lifted after this was performed. The  leg was then placed and extended in adducted position to essentially delivering the femur. We also removed the capsule superiorly and the  piriformis from the piriformis  fossa to gain excellent exposure of the  proximal femur. Rongeur was used to remove some cancellous bone to get  into the lateral portion of the proximal femur for placement of the  initial starter reamer. The starter broaches was placed  the starter broach  and was shown to go down the center of the canal. Broaching  with the  Corail system was then performed starting at size 8, coursing  Up to size 11. A size 11 had excellent torsional and rotational  and axial stability. The trial standard offset neck was then placed  with a 36 + 5 trial  head. The hip was then reduced. We confirmed that  the stem was in the canal both on AP and lateral x-rays. It also has excellent sizing. The hip was reduced with outstanding stability through full extension, full external rotation,  and then flexion in adduction internal rotation. AP pelvis was taken  and the leg lengths were measured and found to be exactly equal. Hip  was then dislocated again and the femoral head and neck removed. The  femoral broach was removed. Size 11 Corail stem with a standard offset  neck was then impacted into the femur following native anteversion. Has  excellent purchase in the canal. Excellent torsional and rotational and  axial stability. It is confirmed to be in the canal on AP and lateral  fluoroscopic views. The 36 + 5 ceramic head was placed and the hip  reduced with outstanding stability. Again AP pelvis was taken and it  confirmed that the leg lengths were equal. The wound was then copiously  irrigated with saline solution and the capsule reattached and repaired  with Ethibond suture.  20 mL of Exparel mixed with 50 mL of saline then additional 20 ml of .25% Bupivicaine injected into the capsule and into the edge of the tensor fascia lata as well as subcutaneous tissue. The fascia overlying the tensor fascia lata was  then closed with a running #1 V-Loc. Subcu was closed with interrupted  2-0 Vicryl and subcuticular running 4-0 Monocryl. Incision was cleaned  and dried. Steri-Strips and a bulky sterile dressing applied. Hemovac  drain was hooked to suction and then he was awakened and transported to  recovery in stable condition.        Please note that a surgical assistant was a medical necessity for this procedure to perform it in a safe and expeditious manner. Assistant was necessary to provide appropriate retraction of vital neurovascular structures and to prevent femoral fracture and allow for anatomic placement of the prosthesis.  Ollen GrossFrank Vianna Morales, M.D.

## 2014-12-27 ENCOUNTER — Encounter (HOSPITAL_COMMUNITY): Payer: Self-pay | Admitting: Orthopedic Surgery

## 2014-12-27 LAB — BASIC METABOLIC PANEL
Anion gap: 8 (ref 5–15)
BUN: 9 mg/dL (ref 6–23)
CO2: 24 mmol/L (ref 19–32)
Calcium: 8.4 mg/dL (ref 8.4–10.5)
Chloride: 105 mmol/L (ref 96–112)
Creatinine, Ser: 0.86 mg/dL (ref 0.50–1.35)
GFR calc Af Amer: >90 mL/min (ref >90)
Glucose, Bld: 140 mg/dL — ABNORMAL HIGH (ref 70–99)
Potassium: 4.2 mmol/L (ref 3.5–5.1)
SODIUM: 137 mmol/L (ref 135–145)

## 2014-12-27 LAB — CBC
HEMATOCRIT: 34.5 % — AB (ref 39.0–52.0)
Hemoglobin: 11.3 g/dL — ABNORMAL LOW (ref 13.0–17.0)
MCH: 29 pg (ref 26.0–34.0)
MCHC: 32.8 g/dL (ref 30.0–36.0)
MCV: 88.7 fL (ref 78.0–100.0)
Platelets: 160 10*3/uL (ref 150–400)
RBC: 3.89 MIL/uL — ABNORMAL LOW (ref 4.22–5.81)
RDW: 15.2 % (ref 11.5–15.5)
WBC: 9.1 10*3/uL (ref 4.0–10.5)

## 2014-12-27 MED ORDER — LIP MEDEX EX OINT
TOPICAL_OINTMENT | CUTANEOUS | Status: AC
  Administered 2014-12-27: 02:00:00
  Filled 2014-12-27: qty 7

## 2014-12-27 NOTE — Progress Notes (Signed)
OT Cancellation Note  Patient Details Name: Cody Morales MRN: [id number] DOB: 28-Nov-1955   Cancelled Treatment:    Reason Eval/Treat Not Completed: OT screened, no needs identified, will sign off  Spoke with pt regarding OT.  No OT needs. Will sign off  Giannina Bartolome, Metro KungLorraine D 12/27/2014, 11:24 AM

## 2014-12-27 NOTE — Progress Notes (Signed)
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  Follow Up Recommendations  Home health PT     Equipment Recommendations  None recommended by PT    Recommendations for Other Services OT consult     Precautions / Restrictions Precautions Precautions: Fall Restrictions Weight Bearing Restrictions: No Other Position/Activity Restrictions: WBAT    Mobility  Bed Mobility Overal bed mobility: Needs Assistance Bed Mobility: Supine to Sit;Sit to Supine     Supine to sit: Supervision Sit to supine: Min assist   General bed mobility comments: cues for sequence and use of R LE to self assist  Transfers Overall transfer level: Needs assistance Equipment used: Rolling walker (2 wheeled) Transfers: Sit to/from Stand Sit to Stand: Min guard         General transfer comment: cues for LE management and use of UEs to self assist  Ambulation/Gait Ambulation/Gait assistance: Min guard Ambulation Distance (Feet): 456 Feet Assistive device: Rolling walker (2 wheeled) Gait Pattern/deviations: Step-to pattern;Step-through pattern;Decreased step length - right;Decreased step length - left;Shuffle;Trunk flexed Gait velocity: decr   General Gait Details: cues for posture, position from RW, ER on L and initial sequence   Stairs Stairs: Yes Stairs assistance: Min assist Stair Management: Two rails;Step to pattern;Forwards Number of Stairs: 2 General stair comments: min cues for sequence and foot placement  Wheelchair Mobility    Modified Rankin (Stroke Patients Only)       Balance                                    Cognition Arousal/Alertness: Awake/alert Behavior During Therapy: WFL for tasks  assessed/performed Overall Cognitive Status: Within Functional Limits for tasks assessed                      Exercises      General Comments        Pertinent Vitals/Pain Pain Assessment: 0-10 Pain Score: 3  Pain Location: L hip/thigh Pain Descriptors / Indicators: Aching;Sore;Numbness Pain Intervention(s): Limited activity within patient's tolerance;Monitored during session;Premedicated before session;Ice applied    Home Living                      Prior Function            PT Goals (current goals can now be found in the care plan section) Acute Rehab PT Goals Patient Stated Goal: Resume previous lifestyle with decreased pain PT Goal Formulation: With patient Time For Goal Achievement: 01/02/15 Potential to Achieve Goals: Good Progress towards PT goals: Progressing toward goals    Frequency  7X/week    PT Plan Current plan remains appropriate    Co-evaluation             End of Session Equipment Utilized During Treatment: Gait belt Activity Tolerance: Patient tolerated treatment well Patient left: in bed;with call bell/phone within reach     Time: 1350-1428 PT Time Calculation (min) (ACUTE ONLY): 38 min  Charges:  $Gait Training: 23-37 mins                    G Codes:  Saria Haran 12/27/2014, 3:50 PM

## 2014-12-27 NOTE — Evaluation (Signed)
Physical Therapy Evaluation Patient Details Name: Cody Morales MRN: [id number] DOB: Mar 18, 1956 Today's Date: 12/27/2014   History of Present Illness  L THR - ant direct; s/p R THR - post lat 9/15  Clinical Impression  Pt s/p L THR presents with decreased L LE strength/ROM and post op pain limiting functional mobility.  Pt should progress well to d.c home with family assist and HHPT follow up.   Follow Up Recommendations Home health PT    Equipment Recommendations  None recommended by PT    Recommendations for Other Services OT consult     Precautions / Restrictions Precautions Precautions: Fall Restrictions Weight Bearing Restrictions: No Other Position/Activity Restrictions: WBAT      Mobility  Bed Mobility Overal bed mobility: Needs Assistance Bed Mobility: Supine to Sit     Supine to sit: Min guard     General bed mobility comments: cues for sequence and use of R LE to self assist  Transfers Overall transfer level: Needs assistance Equipment used: Rolling walker (2 wheeled) Transfers: Sit to/from Stand Sit to Stand: Min assist         General transfer comment: cues for LE management and use of UEs to self assist  Ambulation/Gait Ambulation/Gait assistance: Min assist;Min guard Ambulation Distance (Feet): 200 Feet Assistive device: Rolling walker (2 wheeled) Gait Pattern/deviations: Step-to pattern;Step-through pattern;Decreased step length - right;Decreased step length - left;Shuffle;Trunk flexed     General Gait Details: cues for posture, position from RW, ER on L and initial sequence  Stairs            Wheelchair Mobility    Modified Rankin (Stroke Patients Only)       Balance                                             Pertinent Vitals/Pain Pain Assessment: 0-10 Pain Score: 3  Pain Location: L thigh/hip Pain Descriptors / Indicators: Aching;Sore Pain Intervention(s): Limited activity within patient's  tolerance;Monitored during session;Premedicated before session;Ice applied    Home Living Family/patient expects to be discharged to:: Private residence Living Arrangements: Spouse/significant other Available Help at Discharge: Family Type of Home: House Home Access: Stairs to enter Entrance Stairs-Rails: None Entrance Stairs-Number of Steps: 1 Home Layout: Two level;Other (Comment) Home Equipment: Toilet riser;Cane - single point;Walker - 2 wheels      Prior Function Level of Independence: Independent;Independent with assistive device(s)               Hand Dominance        Extremity/Trunk Assessment   Upper Extremity Assessment: Overall WFL for tasks assessed           Lower Extremity Assessment: RLE deficits/detail;LLE deficits/detail RLE Deficits / Details: Strength WFL with hip AROM ltd to ~75 flex and 20 abd LLE Deficits / Details: 2+/5 hip strength with AAROM at hip to 70 flex and 15 abd  Cervical / Trunk Assessment: Normal  Communication   Communication: No difficulties  Cognition Arousal/Alertness: Awake/alert Behavior During Therapy: WFL for tasks assessed/performed Overall Cognitive Status: Within Functional Limits for tasks assessed                      General Comments      Exercises Total Joint Exercises Ankle Circles/Pumps: AROM;Both;15 reps;Supine Quad Sets: AROM;Both;10 reps;Supine Heel Slides: AAROM;Left;Supine;20 reps Hip ABduction/ADduction: AAROM;Left;10 reps;Supine  Assessment/Plan    PT Assessment Patient needs continued PT services  PT Diagnosis Difficulty walking   PT Problem List Decreased strength;Decreased range of motion;Decreased activity tolerance;Decreased mobility;Decreased knowledge of use of DME;Pain  PT Treatment Interventions DME instruction;Gait training;Stair training;Functional mobility training;Therapeutic activities;Therapeutic exercise;Patient/family education   PT Goals (Current goals can be  found in the Care Plan section) Acute Rehab PT Goals Patient Stated Goal: Resume previous lifestyle with decreased pain PT Goal Formulation: With patient Time For Goal Achievement: 01/02/15 Potential to Achieve Goals: Good    Frequency 7X/week   Barriers to discharge        Co-evaluation               End of Session Equipment Utilized During Treatment: Gait belt Activity Tolerance: Patient tolerated treatment well Patient left: Other (comment) (EOB for bathing) Nurse Communication: Mobility status         Time: [id number] PT Time Calculation (min) (ACUTE ONLY): 36 min   Charges:   PT Evaluation $Initial PT Evaluation Tier I: 1 Procedure PT Treatments $Therapeutic Exercise: 8-22 mins   PT G Codes:        Lonzo Saulter 12/27/2014, 12:35 PM

## 2014-12-27 NOTE — Discharge Instructions (Addendum)
°Dr. Frank Aluisio °Total Joint Specialist °[facility] °[address] °[place], [place] [address] °[phone] ° ° ° °ANTERIOR APPROACH TOTAL HIP REPLACEMENT POSTOPERATIVE DIRECTIONS ° ° °Hip Rehabilitation, Guidelines Following Surgery  °The results of a hip operation are greatly improved after range of motion and muscle strengthening exercises. Follow all safety measures which are given to protect your hip. If any of these exercises cause increased pain or swelling in your joint, decrease the amount until you are comfortable again. Then slowly increase the exercises. Call your caregiver if you have problems or questions.  °HOME CARE INSTRUCTIONS  °Most of the following instructions are designed to prevent the dislocation of your new hip.  °Remove items at home which could result in a fall. This includes throw rugs or furniture in walking pathways.  °Continue medications as instructed at time of discharge. °· You may have some home medications which will be placed on hold until you complete the course of blood thinner medication. °· You may start showering once you are discharged home but do not submerge the incision under water. Just pat the incision dry and apply a dry gauze dressing on daily. °Do not put on socks or shoes without following the instructions of your caregivers.  °Sit on high chairs which makes it easier to stand.  °Sit on chairs with arms. Use the chair arms to help push yourself up when arising.  °Keep your leg on the side of the operation out in front of you when standing up.  °Arrange for the use of a toilet seat elevator so you are not sitting low.   °· Walk with walker as instructed.  °You may resume a sexual relationship in one month or when given the OK by your caregiver.  °Use walker as long as suggested by your caregivers.  °You may put full weight on your legs and walk as much as is comfortable. °Avoid periods of inactivity such as sitting longer than an hour  when not asleep. This helps prevent blood clots.  °You may return to work once you are cleared by your surgeon.  °Do not drive a car for 6 weeks or until released by your surgeon.  °Do not drive while taking narcotics.  °Wear elastic stockings for three weeks following surgery during the day but you may remove then at night.  °Make sure you keep all of your appointments after your operation with all of your doctors and caregivers. You should call the office at the above phone number and make an appointment for approximately two weeks after the date of your surgery. °Change the dressing daily and reapply a dry dressing each time. °Please pick up a stool softener and laxative for home use as long as you are requiring pain medications. °· ICE to the affected hip every three hours for 30 minutes at a time and then as needed for pain and swelling.  Continue to use ice on the hip for pain and swelling from surgery. You may notice swelling that will progress down to the foot and ankle.  This is normal after surgery.  Elevate the leg when you are not up walking on it.   °It is important for you to complete the blood thinner medication as prescribed by your doctor. °· Continue to use the breathing machine which will help keep your temperature down.  It is common for your temperature to cycle up and down following surgery, especially at night when you are not up moving around   and exerting yourself.  The breathing machine keeps your lungs expanded and your temperature down. ° °RANGE OF MOTION AND STRENGTHENING EXERCISES  °These exercises are designed to help you keep full movement of your hip joint. Follow your caregiver's or physical therapist's instructions. Perform all exercises about fifteen times, three times per day or as directed. Exercise both hips, even if you have had only one joint replacement. These exercises can be done on a training (exercise) mat, on the floor, on a table or on a bed. Use whatever works the best  and is most comfortable for you. Use music or television while you are exercising so that the exercises are a pleasant break in your day. This will make your life better with the exercises acting as a break in routine you can look forward to.  °Lying on your back, slowly slide your foot toward your buttocks, raising your knee up off the floor. Then slowly slide your foot back down until your leg is straight again.  °Lying on your back spread your legs as far apart as you can without causing discomfort.  °Lying on your side, raise your upper leg and foot straight up from the floor as far as is comfortable. Slowly lower the leg and repeat.  °Lying on your back, tighten up the muscle in the front of your thigh (quadriceps muscles). You can do this by keeping your leg straight and trying to raise your heel off the floor. This helps strengthen the largest muscle supporting your knee.  °Lying on your back, tighten up the muscles of your buttocks both with the legs straight and with the knee bent at a comfortable angle while keeping your heel on the floor.  ° °SKILLED REHAB INSTRUCTIONS: °If the patient is transferred to a skilled rehab facility following release from the hospital, a list of the current medications will be sent to the facility for the patient to continue.  When discharged from the skilled rehab facility, please have the facility set up the patient's Home Health Physical Therapy prior to being released. Also, the skilled facility will be responsible for providing the patient with their medications at time of release from the facility to include their pain medication, the muscle relaxants, and their blood thinner medication. If the patient is still at the rehab facility at time of the two week follow up appointment, the skilled rehab facility will also need to assist the patient in arranging follow up appointment in our office and any transportation needs. ° °MAKE SURE YOU:  °Understand these instructions.   °Will watch your condition.  °Will get help right away if you are not doing well or get worse. ° °Pick up stool softner and laxative for home use following surgery while on pain medications. °Do not submerge incision under water. °Please use good hand washing techniques while changing dressing each day. °May shower starting three days after surgery. °Please use a clean towel to pat the incision dry following showers. °Continue to use ice for pain and swelling after surgery. °Do not use any lotions or creams on the incision until instructed by your surgeon. °Total Hip Protocol. ° °Take Xarelto for two and a half more weeks, then discontinue Xarelto. °Once the patient has completed the blood thinner regimen, then take a Baby 81 mg Aspirin daily for three more weeks. ° °Postoperative Constipation Protocol ° °Constipation - defined medically as fewer than three stools per week and severe constipation as less than one stool per week. ° °One   of the most common issues patients have following surgery is constipation.  Even if you have a regular bowel pattern at home, your normal regimen is likely to be disrupted due to multiple reasons following surgery.  Combination of anesthesia, postoperative narcotics, change in appetite and fluid intake all can affect your bowels.  In order to avoid complications following surgery, here are some recommendations in order to help you during your recovery period. ° °Colace (docusate) - Pick up an over-the-counter form of Colace or another stool softener and take twice a day as long as you are requiring postoperative pain medications.  Take with a full glass of water daily.  If you experience loose stools or diarrhea, hold the colace until you stool forms back up.  If your symptoms do not get better within 1 week or if they get worse, check with your doctor. ° °Dulcolax (bisacodyl) - Pick up over-the-counter and take as directed by the product packaging as needed to assist with the  movement of your bowels.  Take with a full glass of water.  Use this product as needed if not relieved by Colace only.  ° °MiraLax (polyethylene glycol) - Pick up over-the-counter to have on hand.  MiraLax is a solution that will increase the amount of water in your bowels to assist with bowel movements.  Take as directed and can mix with a glass of water, juice, soda, coffee, or tea.  Take if you go more than two days without a movement. °Do not use MiraLax more than once per day. Call your doctor if you are still constipated or irregular after using this medication for 7 days in a row. ° °If you continue to have problems with postoperative constipation, please contact the office for further assistance and recommendations.  If you experience "the worst abdominal pain ever" or develop nausea or vomiting, please contact the office immediatly for further recommendations for treatment. ° ° ° °Information on my medicine - XARELTO® (Rivaroxaban) ° °This medication education was reviewed with me or my healthcare representative as part of my discharge preparation.  The pharmacist that spoke with me during my hospital stay was:  Legge, Justin Marshall, RPH ° °Why was Xarelto® prescribed for you? °Xarelto® was prescribed for you to reduce the risk of blood clots forming after orthopedic surgery. The medical term for these abnormal blood clots is venous thromboembolism (VTE). ° °What do you need to know about xarelto® ? °Take your Xarelto® ONCE DAILY at the same time every day. °You may take it either with or without food. ° °If you have difficulty swallowing the tablet whole, you may crush it and mix in applesauce just prior to taking your dose. ° °Take Xarelto® exactly as prescribed by your doctor and DO NOT stop taking Xarelto® without talking to the doctor who prescribed the medication.  Stopping without other VTE prevention medication to take the place of Xarelto® may increase your risk of developing a clot. ° °After  discharge, you should have regular check-up appointments with your healthcare provider that is prescribing your Xarelto®.   ° °What do you do if you miss a dose? °If you miss a dose, take it as soon as you remember on the same day then continue your regularly scheduled once daily regimen the next day. Do not take two doses of Xarelto® on the same day.  ° °Important Safety Information °A possible side effect of Xarelto® is bleeding. You should call your healthcare provider right away if you experience   any of the following: °? Bleeding from an injury or your nose that does not stop. °? Unusual colored urine (red or dark brown) or unusual colored stools (red or black). °? Unusual bruising for unknown reasons. °? A serious fall or if you hit your head (even if there is no bleeding). ° °Some medicines may interact with Xarelto® and might increase your risk of bleeding while on Xarelto®. To help avoid this, consult your healthcare provider or pharmacist prior to using any new prescription or non-prescription medications, including herbals, vitamins, non-steroidal anti-inflammatory drugs (NSAIDs) and supplements. ° °This website has more information on Xarelto®: www.xarelto.com. ° ° ° °

## 2014-12-27 NOTE — Care Management Note (Addendum)
    Page 1 of 1   12/27/2014     2:16:23 PM CARE MANAGEMENT NOTE 12/27/2014  Patient:  Cody Morales, Cody Morales   Account Number:  [id number]  Date Initiated:  12/28/2014  Documentation initiated by:  [facility]  Subjective/Objective Assessment:   adm: LEFT TOTAL HIP ARTHROPLASTY ANTERIOR APPROACH (Left)     Action/Plan:   discharge planning   Anticipated DC Date:  12/28/2014   Anticipated DC Plan:  [place]  CM consult      [facility] Choice  HOME HEALTH   Choice offered to / List presented to:  C-1 Patient        [place] arranged  [facility]      [place]   Status of service:  Completed, signed off Medicare Important Message given?   (If response is "NO", the following Medicare IM given date fields will be blank) Date Medicare IM given:   Medicare IM given by:   Date Additional Medicare IM given:   Additional Medicare IM given by:    Discharge Disposition:  [place]  Per UR Regulation:    If discussed at Long Length of Stay Meetings, dates discussed:    Comments:  12/27/14 13:05 CM met with pt in room to offer choice of home health agency.  Pt chooses [facility] and in particular "[name]" as his PT.  Address and contact information verified by pt. Pt has 3n1 and rolling wlker from previous surgery. CM called [facility] rep, Kristen with referral with request for [employer].  No other CM needs were communicated. Mariane Masters, BSN,  [place], [phone].

## 2014-12-27 NOTE — Progress Notes (Signed)
   Subjective: 1 Day Post-Op Procedure(s) (LRB): LEFT TOTAL HIP ARTHROPLASTY ANTERIOR APPROACH (Left) Patient reports pain as mild.   Patient seen in rounds with Dr. Lequita HaltAluisio. Patient is well, and has had no acute complaints or problems We will start therapy today.  Plan is to go Home after hospital stay.  Objective: Vital signs in last 24 hours: Temp:  [97.5 F (36.4 C)-99.1 F (37.3 C)] 98.4 F (36.9 C) (02/11 0532) Pulse Rate:  [94-115] 101 (02/11 0532) Resp:  [11-16] 16 (02/11 0532) BP: (116-149)/(70-104) 116/70 mmHg (02/11 0532) SpO2:  [92 %-100 %] 99 % (02/11 0532) Weight:  [98.884 kg (218 lb)-99.066 kg (218 lb 6.4 oz)] 98.884 kg (218 lb) (02/10 1703)  Intake/Output from previous day:  Intake/Output Summary (Last 24 hours) at 12/27/14 0947 Last data filed at 12/27/14 0850  Gross per 24 hour  Intake   3910 ml  Output   2901 ml  Net   1009 ml    Intake/Output this shift: Total I/O In: 360 [P.O.:360] Out: -   Labs:  Recent Labs  12/27/14 0432  HGB 11.3*    Recent Labs  12/27/14 0432  WBC 9.1  RBC 3.89*  HCT 34.5*  PLT 160    Recent Labs  12/27/14 0432  NA 137  K 4.2  CL 105  CO2 24  BUN 9  CREATININE 0.86  GLUCOSE 140*  CALCIUM 8.4   No results for input(s): LABPT, INR in the last 72 hours.  EXAM General - Patient is Alert, Appropriate and Oriented Extremity - Neurovascular intact Sensation intact distally Dorsiflexion/Plantar flexion intact Dressing - dressing C/D/I Motor Function - intact, moving foot and toes well on exam.  Hemovac pulled without difficulty.  Past Medical History  Diagnosis Date  . Hypertension   . Enlarged thyroid   . GERD (gastroesophageal reflux disease)   . Arthritis     Assessment/Plan: 1 Day Post-Op Procedure(s) (LRB): LEFT TOTAL HIP ARTHROPLASTY ANTERIOR APPROACH (Left) Active Problems:   OA (osteoarthritis) of hip  Estimated body mass index is 30.42 kg/(m^2) as calculated from the following:  Height as of this encounter: 5\' 11"  (1.803 m).   Weight as of this encounter: 98.884 kg (218 lb). Advance diet Up with therapy Plan for discharge tomorrow Discharge home with home health  DVT Prophylaxis - Xarelto Weight Bearing As Tolerated left Leg Hemovac Pulled Begin Therapy  Avel Peacerew Perkins, PA-C Orthopaedic Surgery 12/27/2014, 9:47 AM

## 2014-12-28 LAB — BASIC METABOLIC PANEL
ANION GAP: 10 (ref 5–15)
BUN: 12 mg/dL (ref 6–23)
CO2: 26 mmol/L (ref 19–32)
Calcium: 8.5 mg/dL (ref 8.4–10.5)
Chloride: 104 mmol/L (ref 96–112)
Creatinine, Ser: 0.72 mg/dL (ref 0.50–1.35)
GFR calc Af Amer: >90 mL/min (ref >90)
GFR calc non Af Amer: >90 mL/min (ref >90)
GLUCOSE: 111 mg/dL — AB (ref 70–99)
POTASSIUM: 3.7 mmol/L (ref 3.5–5.1)
SODIUM: 140 mmol/L (ref 135–145)

## 2014-12-28 LAB — CBC
HCT: 32.1 % — ABNORMAL LOW (ref 39.0–52.0)
Hemoglobin: 10.4 g/dL — ABNORMAL LOW (ref 13.0–17.0)
MCH: 29.1 pg (ref 26.0–34.0)
MCHC: 32.4 g/dL (ref 30.0–36.0)
MCV: 89.7 fL (ref 78.0–100.0)
PLATELETS: 161 10*3/uL (ref 150–400)
RBC: 3.58 MIL/uL — ABNORMAL LOW (ref 4.22–5.81)
RDW: 15.3 % (ref 11.5–15.5)
WBC: 13.3 10*3/uL — AB (ref 4.0–10.5)

## 2014-12-28 MED ORDER — RIVAROXABAN 10 MG PO TABS: 10.0000 mg | ORAL_TABLET | Freq: Every day | ORAL | Status: AC

## 2014-12-28 MED ORDER — OXYCODONE HCL 5 MG PO TABS: 5.0000 mg | ORAL_TABLET | ORAL | Status: AC | PRN

## 2014-12-28 MED ORDER — METHOCARBAMOL 500 MG PO TABS: 500.0000 mg | ORAL_TABLET | Freq: Four times a day (QID) | ORAL | Status: AC | PRN

## 2014-12-28 MED ORDER — TRAMADOL HCL 50 MG PO TABS: 50.0000 mg | ORAL_TABLET | Freq: Four times a day (QID) | ORAL | Status: AC | PRN

## 2014-12-28 NOTE — Discharge Summary (Signed)
Physician Discharge Summary   Patient ID: Cody Morales MRN: [id number] DOB/AGE: 03/14/1956 59 y.o.  Admit date: 12/26/2014 Discharge date: 12/28/2014  Primary Diagnosis:  Osteoarthritis of the Left hip.  Admission Diagnoses:  Past Medical History  Diagnosis Date  . Hypertension   . Enlarged thyroid   . GERD (gastroesophageal reflux disease)   . Arthritis    Discharge Diagnoses:   Active Problems:   OA (osteoarthritis) of hip  Estimated body mass index is 30.42 kg/(m^2) as calculated from the following:   Height as of this encounter: _0  (1.803 m).   Weight as of this encounter: 98.884 kg (218 lb).  Procedure(s) (LRB): LEFT TOTAL HIP ARTHROPLASTY ANTERIOR APPROACH (Left)   Consults: None  HPI: Cody Morales is a 59 y.o. male who has advanced end-  stage arthritis of his Left hip with progressively worsening pain and  dysfunction.The patient has failed nonoperative management and presents for  total hip arthroplasty.   Laboratory Data: Admission on 12/26/2014, Discharged on 12/28/2014  Component Date Value Ref Range Status  . Color, Urine 12/26/2014 AMBER* YELLOW Final   BIOCHEMICALS MAY BE AFFECTED BY COLOR  . APPearance 12/26/2014 HAZY* CLEAR Final  . Specific Gravity, Urine 12/26/2014 1.023  1.005 - 1.030 Final  . pH 12/26/2014 5.0  5.0 - 8.0 Final  . Glucose, UA 12/26/2014 NEGATIVE  NEGATIVE mg/dL Final  . Hgb urine dipstick 12/26/2014 NEGATIVE  NEGATIVE Final  . Bilirubin Urine 12/26/2014 SMALL* NEGATIVE Final  . Ketones, ur 12/26/2014 NEGATIVE  NEGATIVE mg/dL Final  . Protein, ur 12/26/2014 NEGATIVE  NEGATIVE mg/dL Final  . Urobilinogen, UA 12/26/2014 0.2  0.0 - 1.0 mg/dL Final  . Nitrite 12/26/2014 NEGATIVE  NEGATIVE Final  . Leukocytes, UA 12/26/2014 NEGATIVE  NEGATIVE Final   MICROSCOPIC NOT DONE ON URINES WITH NEGATIVE PROTEIN, BLOOD, LEUKOCYTES, NITRITE, OR GLUCOSE <1000 mg/dL.  . WBC 12/27/2014 9.1  4.0 - 10.5 K/uL Final  . RBC 12/27/2014  3.89* 4.22 - 5.81 MIL/uL Final  . Hemoglobin 12/27/2014 11.3* 13.0 - 17.0 g/dL Final  . HCT 12/27/2014 34.5* 39.0 - 52.0 % Final  . MCV 12/27/2014 88.7  78.0 - 100.0 fL Final  . MCH 12/27/2014 29.0  26.0 - 34.0 pg Final  . MCHC 12/27/2014 32.8  30.0 - 36.0 g/dL Final  . RDW 12/27/2014 15.2  11.5 - 15.5 % Final  . Platelets 12/27/2014 160  150 - 400 K/uL Final  . Sodium 12/27/2014 137  135 - 145 mmol/L Final  . Potassium 12/27/2014 4.2  3.5 - 5.1 mmol/L Final  . Chloride 12/27/2014 105  96 - 112 mmol/L Final  . CO2 12/27/2014 24  19 - 32 mmol/L Final  . Glucose, Bld 12/27/2014 140* 70 - 99 mg/dL Final  . BUN 12/27/2014 9  6 - 23 mg/dL Final  . Creatinine, Ser 12/27/2014 0.86  0.50 - 1.35 mg/dL Final  . Calcium 12/27/2014 8.4  8.4 - 10.5 mg/dL Final  . GFR calc non Af Amer 12/27/2014 >90  >90 mL/min Final  . GFR calc Af Amer 12/27/2014 >90  >90 mL/min Final   Comment: (NOTE) The eGFR has been calculated using the CKD EPI equation. This calculation has not been validated in all clinical situations. eGFR's persistently <90 mL/min signify possible Chronic Kidney Disease.   . Anion gap 12/27/2014 8  5 - 15 Final  . WBC 12/28/2014 13.3* 4.0 - 10.5 K/uL Final  . RBC 12/28/2014 3.58* 4.22 - 5.81 MIL/uL Final  . Hemoglobin 12/28/2014 10.4*  13.0 - 17.0 g/dL Final  . HCT 12/28/2014 32.1* 39.0 - 52.0 % Final  . MCV 12/28/2014 89.7  78.0 - 100.0 fL Final  . MCH 12/28/2014 29.1  26.0 - 34.0 pg Final  . MCHC 12/28/2014 32.4  30.0 - 36.0 g/dL Final  . RDW 12/28/2014 15.3  11.5 - 15.5 % Final  . Platelets 12/28/2014 161  150 - 400 K/uL Final  . Sodium 12/28/2014 140  135 - 145 mmol/L Final  . Potassium 12/28/2014 3.7  3.5 - 5.1 mmol/L Final  . Chloride 12/28/2014 104  96 - 112 mmol/L Final  . CO2 12/28/2014 26  19 - 32 mmol/L Final  . Glucose, Bld 12/28/2014 111* 70 - 99 mg/dL Final  . BUN 12/28/2014 12  6 - 23 mg/dL Final  . Creatinine, Ser 12/28/2014 0.72  0.50 - 1.35 mg/dL Final  . Calcium  12/28/2014 8.5  8.4 - 10.5 mg/dL Final  . GFR calc non Af Amer 12/28/2014 >90  >90 mL/min Final  . GFR calc Af Amer 12/28/2014 >90  >90 mL/min Final   Comment: (NOTE) The eGFR has been calculated using the CKD EPI equation. This calculation has not been validated in all clinical situations. eGFR's persistently <90 mL/min signify possible Chronic Kidney Disease.   Georgiann Hahn gap 12/28/2014 10  5 - 15 Final  Hospital Outpatient Visit on 12/20/2014  Component Date Value Ref Range Status  . WBC 12/20/2014 5.6  4.0 - 10.5 K/uL Final  . RBC 12/20/2014 4.91  4.22 - 5.81 MIL/uL Final  . Hemoglobin 12/20/2014 14.4  13.0 - 17.0 g/dL Final  . HCT 12/20/2014 43.9  39.0 - 52.0 % Final  . MCV 12/20/2014 89.4  78.0 - 100.0 fL Final  . MCH 12/20/2014 29.3  26.0 - 34.0 pg Final  . MCHC 12/20/2014 32.8  30.0 - 36.0 g/dL Final  . RDW 12/20/2014 15.1  11.5 - 15.5 % Final  . Platelets 12/20/2014 185  150 - 400 K/uL Final  . Sodium 12/20/2014 139  135 - 145 mmol/L Final  . Potassium 12/20/2014 3.9  3.5 - 5.1 mmol/L Final  . Chloride 12/20/2014 106  96 - 112 mmol/L Final  . CO2 12/20/2014 24  19 - 32 mmol/L Final  . Glucose, Bld 12/20/2014 97  70 - 99 mg/dL Final  . BUN 12/20/2014 11  6 - 23 mg/dL Final  . Creatinine, Ser 12/20/2014 0.89  0.50 - 1.35 mg/dL Final  . Calcium 12/20/2014 9.2  8.4 - 10.5 mg/dL Final  . GFR calc non Af Amer 12/20/2014 >90  >90 mL/min Final  . GFR calc Af Amer 12/20/2014 >90  >90 mL/min Final   Comment: (NOTE) The eGFR has been calculated using the CKD EPI equation. This calculation has not been validated in all clinical situations. eGFR's persistently <90 mL/min signify possible Chronic Kidney Disease.   . Anion gap 12/20/2014 9  5 - 15 Final  . aPTT 12/20/2014 28  24 - 37 seconds Final  . Prothrombin Time 12/20/2014 13.6  11.6 - 15.2 seconds Final  . INR 12/20/2014 1.03  0.00 - 1.49 Final  . MRSA, PCR 12/20/2014 NEGATIVE  NEGATIVE Final  . Staphylococcus aureus 12/20/2014  NEGATIVE  NEGATIVE Final   Comment:        The Xpert SA Assay (FDA approved for NASAL specimens in patients over 2 years of age), is one component of a comprehensive surveillance program.  Test performance has been validated by [facility] for patients greater  than or equal to 92 year old. It is not intended to diagnose infection nor to guide or monitor treatment.   . ABO/RH(D) 12/20/2014 O POS   Final  . Antibody Screen 12/20/2014 NEG   Final  . Sample Expiration 12/20/2014 12/29/2014   Final     X-Rays:Dg Pelvis Portable  12/26/2014   CLINICAL DATA:  Status post left hip arthroplasty.  EXAM: PORTABLE PELVIS 1-2 VIEWS  COMPARISON:  08/15/2014  FINDINGS: Alignment of new left hip arthroplasty appears normal. Old right hip arthroplasty shows stable and normal alignment.  IMPRESSION: Normal alignment of newly placed left hip arthroplasty.   Electronically Signed   By: Aletta Edouard M.D.   On: 12/26/2014 16:33   Dg C-arm 1-60 Min-no Report  12/26/2014   CLINICAL DATA: left hip osteoarthritis   C-ARM 1-60 MINUTES  Fluoroscopy was utilized by the requesting physician.  No radiographic  interpretation.     EKG:No orders found for this or any previous visit.   Hospital Course: Patient was admitted to [facility] and taken to the OR and underwent the above state procedure without complications.  Patient tolerated the procedure well and was later transferred to the recovery room and then to the orthopaedic floor for postoperative care.  They were given PO and IV analgesics for pain control following their surgery.  They were given 24 hours of postoperative antibiotics of  Anti-infectives    Start     Dose/Rate Route Frequency Ordered Stop   12/26/14 2000  ceFAZolin (ANCEF) IVPB 2 g/50 mL premix     2 g 100 mL/hr over 30 Minutes Intravenous Every 6 hours 12/26/14 1722 12/27/14 0213   12/26/14 1140  ceFAZolin (ANCEF) IVPB 2 g/50 mL premix     2 g 100 mL/hr over 30 Minutes  Intravenous On call to O.R. 12/26/14 1140 12/26/14 1357     and started on DVT prophylaxis in the form of Xarelto.   PT and OT were ordered for total hip protocol.  The patient was allowed to be WBAT with therapy. Discharge planning was consulted to help with postop disposition and equipment needs.  Patient had a decent night on the evening of surgery.  They started to get up OOB with therapy on day one.  Hemovac drain was pulled without difficulty.  Continued to work with therapy into day two.  Dressing was changed on day two and the incision was healing well.  Patient was seen in rounds and was ready to go home.  Discharge home with home health Diet - Cardiac diet Follow up - in 2 weeks Activity - WBAT Disposition - Home Condition Upon Discharge - Good D/C Meds - See DC Summary DVT Prophylaxis - Xarelto      Discharge Instructions    Call MD / Call 911    Complete by:  As directed   If you experience chest pain or shortness of breath, CALL 911 and be transported to the hospital emergency room.  If you develope a fever above 101 F, pus (white drainage) or increased drainage or redness at the wound, or calf pain, call your surgeon's office.     Change dressing    Complete by:  As directed   You may change your dressing dressing daily with sterile 4 x 4 inch gauze dressing and paper tape.  Do not submerge the incision under water.     Constipation Prevention    Complete by:  As directed   Drink plenty of fluids.  Prune juice may be helpful.  You may use a stool softener, such as Colace (over the counter) 100 mg twice a day.  Use MiraLax (over the counter) for constipation as needed.     Diet - low sodium heart healthy    Complete by:  As directed      Discharge instructions    Complete by:  As directed   Pick up stool softner and laxative for home use following surgery while on pain medications. Do not submerge incision under water. Please use good hand washing techniques while changing  dressing each day. May shower starting three days after surgery. Please use a clean towel to pat the incision dry following showers. Continue to use ice for pain and swelling after surgery. Do not use any lotions or creams on the incision until instructed by your surgeon.  Total Hip Protocol.  Take Xarelto for two and a half more weeks, then discontinue Xarelto. Once the patient has completed the blood thinner regimen, then take a Baby 81 mg Aspirin daily for three more weeks.  Postoperative Constipation Protocol  Constipation - defined medically as fewer than three stools per week and severe constipation as less than one stool per week.  One of the most common issues patients have following surgery is constipation.  Even if you have a regular bowel pattern at home, your normal regimen is likely to be disrupted due to multiple reasons following surgery.  Combination of anesthesia, postoperative narcotics, change in appetite and fluid intake all can affect your bowels.  In order to avoid complications following surgery, here are some recommendations in order to help you during your recovery period.  Colace (docusate) - Pick up an over-the-counter form of Colace or another stool softener and take twice a day as long as you are requiring postoperative pain medications.  Take with a full glass of water daily.  If you experience loose stools or diarrhea, hold the colace until you stool forms back up.  If your symptoms do not get better within 1 week or if they get worse, check with your doctor.  Dulcolax (bisacodyl) - Pick up over-the-counter and take as directed by the product packaging as needed to assist with the movement of your bowels.  Take with a full glass of water.  Use this product as needed if not relieved by Colace only.   MiraLax (polyethylene glycol) - Pick up over-the-counter to have on hand.  MiraLax is a solution that will increase the amount of water in your bowels to assist with  bowel movements.  Take as directed and can mix with a glass of water, juice, soda, coffee, or tea.  Take if you go more than two days without a movement. Do not use MiraLax more than once per day. Call your doctor if you are still constipated or irregular after using this medication for 7 days in a row.  If you continue to have problems with postoperative constipation, please contact the office for further assistance and recommendations.  If you experience "the worst abdominal pain ever" or develop nausea or vomiting, please contact the office immediatly for further recommendations for treatment.     Do not sit on low chairs, stoools or toilet seats, as it may be difficult to get up from low surfaces    Complete by:  As directed      Driving restrictions    Complete by:  As directed   No driving until released by the physician.  Increase activity slowly as tolerated    Complete by:  As directed      Lifting restrictions    Complete by:  As directed   No lifting until released by the physician.     Patient may shower    Complete by:  As directed   You may shower without a dressing once there is no drainage.  Do not wash over the wound.  If drainage remains, do not shower until drainage stops.     TED hose    Complete by:  As directed   Use stockings (TED hose) for 3 weeks on both leg(s).  You may remove them at night for sleeping.     Weight bearing as tolerated    Complete by:  As directed   Laterality:  left  Extremity:  Lower            Medication List    STOP taking these medications        TURMERIC PO      TAKE these medications        acetaminophen 500 MG tablet  Commonly known as:  TYLENOL  Take 1,000 mg by mouth once as needed for mild pain or headache.     methocarbamol 500 MG tablet  Commonly known as:  ROBAXIN  Take 1 tablet (500 mg total) by mouth every 6 (six) hours as needed for muscle spasms.     NIFEdipine 30 MG 24 hr tablet  Commonly known as:   PROCARDIA-XL/ADALAT CC  Take 30 mg by mouth every morning.     oxyCODONE 5 MG immediate release tablet  Commonly known as:  Oxy IR/ROXICODONE  Take 1-2 tablets (5-10 mg total) by mouth every 3 (three) hours as needed for moderate pain, severe pain or breakthrough pain.     rivaroxaban 10 MG Tabs tablet  Commonly known as:  XARELTO  - Take 1 tablet (10 mg total) by mouth daily with breakfast. Take Xarelto for two and a half more weeks, then discontinue Xarelto.  - Once the patient has completed the blood thinner regimen, then take a Baby 81 mg Aspirin daily for three more weeks.     traMADol 50 MG tablet  Commonly known as:  ULTRAM  Take 1-2 tablets (50-100 mg total) by mouth every 6 (six) hours as needed (mild pain).     traZODone 50 MG tablet  Commonly known as:  DESYREL  Take 50 mg by mouth at bedtime as needed for sleep.       Follow-up Information    Follow up with [facility].   Why:  home health physical therapy   Contact information:   [address] [place] [address] [phone]       Follow up with Gearlean Alf, MD. Schedule an appointment as soon as possible for a visit on 01/08/2015.   Specialty:  Orthopedic Surgery   Why:  Call office at [phone] to set up appointment on Tuesday 01/08/2015 with Dr. Denman George information:   [address] [place] [address] [place] [address] [phone]       Signed: Arlee Muslim, PA-C Orthopaedic Surgery 01/16/2015, 8:07 PM

## 2014-12-28 NOTE — Progress Notes (Signed)
   Subjective: 2 Days Post-Op Procedure(s) (LRB): LEFT TOTAL HIP ARTHROPLASTY ANTERIOR APPROACH (Left) Patient reports pain as mild.   Patient seen in rounds with Dr. Lequita HaltAluisio. Patient is well, and has had no acute complaints or problems Patient is ready to go home  Objective: Vital signs in last 24 hours: Temp:  [98.1 F (36.7 C)-98.4 F (36.9 C)] 98.1 F (36.7 C) (02/12 0501) Pulse Rate:  [89-115] 89 (02/12 0501) Resp:  [16] 16 (02/12 0501) BP: (108-121)/(66-80) 108/66 mmHg (02/12 0501) SpO2:  [96 %-99 %] 99 % (02/12 0501)  Intake/Output from previous day:  Intake/Output Summary (Last 24 hours) at 12/28/14 0914 Last data filed at 12/28/14 0731  Gross per 24 hour  Intake    960 ml  Output   2900 ml  Net  -1940 ml    Intake/Output this shift: Total I/O In: -  Out: 400 [Urine:400]  Labs:  Recent Labs  12/27/14 0432 12/28/14 0527  HGB 11.3* 10.4*    Recent Labs  12/27/14 0432 12/28/14 0527  WBC 9.1 13.3*  RBC 3.89* 3.58*  HCT 34.5* 32.1*  PLT 160 161    Recent Labs  12/27/14 0432 12/28/14 0527  NA 137 140  K 4.2 3.7  CL 105 104  CO2 24 26  BUN 9 12  CREATININE 0.86 0.72  GLUCOSE 140* 111*  CALCIUM 8.4 8.5   No results for input(s): LABPT, INR in the last 72 hours.  EXAM: General - Patient is Alert, Appropriate and Oriented Extremity - Neurovascular intact Sensation intact distally Incision - clean, dry, no drainage, healing Motor Function - intact, moving foot and toes well on exam.   Assessment/Plan: 2 Days Post-Op Procedure(s) (LRB): LEFT TOTAL HIP ARTHROPLASTY ANTERIOR APPROACH (Left) Procedure(s) (LRB): LEFT TOTAL HIP ARTHROPLASTY ANTERIOR APPROACH (Left) Past Medical History  Diagnosis Date  . Hypertension   . Enlarged thyroid   . GERD (gastroesophageal reflux disease)   . Arthritis    Active Problems:   OA (osteoarthritis) of hip  Estimated body mass index is 30.42 kg/(m^2) as calculated from the following:   Height as of  this encounter: 5\' 11"  (1.803 m).   Weight as of this encounter: 98.884 kg (218 lb). Up with therapy Discharge home with home health Diet - Cardiac diet Follow up - in 2 weeks Activity - WBAT Disposition - Home Condition Upon Discharge - Good D/C Meds - See DC Summary DVT Prophylaxis - Xarelto  Avel Peacerew Pegeen Stiger, PA-C Orthopaedic Surgery 12/28/2014, 9:14 AM

## 2014-12-28 NOTE — Progress Notes (Signed)
Physical Therapy Treatment Patient Details Name: Cody Morales MRN: [id number] DOB: 05/01/1956 Today's Date: 12/28/2014    History of Present Illness L THR - ant direct; s/p R THR - post lat 9/15    PT Comments    Pt progressing well.  Reviewed stairs and car transfers  Follow Up Recommendations  Home health PT     Equipment Recommendations  None recommended by PT    Recommendations for Other Services OT consult     Precautions / Restrictions Precautions Precautions: Fall Restrictions Weight Bearing Restrictions: No Other Position/Activity Restrictions: WBAT    Mobility  Bed Mobility Overal bed mobility: Needs Assistance Bed Mobility: Supine to Sit;Sit to Supine     Supine to sit: Supervision Sit to supine: Supervision   General bed mobility comments: cues for sequence and use of R LE to self assist  Transfers Overall transfer level: Needs assistance Equipment used: Rolling walker (2 wheeled) Transfers: Sit to/from Stand Sit to Stand: Supervision            Ambulation/Gait Ambulation/Gait assistance: Supervision Ambulation Distance (Feet): 150 Feet Assistive device: Rolling walker (2 wheeled) Gait Pattern/deviations: Step-through pattern;Decreased step length - right;Decreased step length - left;Shuffle Gait velocity: decr   General Gait Details: min cues for posture and position from RW   Stairs Stairs: Yes Stairs assistance: Min guard Stair Management: No rails;One rail Right;Step to pattern;Forwards;With walker;With crutches Number of Stairs: 11 General stair comments: min cues for sequence and foot placement - 10 steps with crutch and rail and 1 step with RW  Wheelchair Mobility    Modified Rankin (Stroke Patients Only)       Balance                                    Cognition Arousal/Alertness: Awake/alert Behavior During Therapy: WFL for tasks assessed/performed Overall Cognitive Status: Within Functional Limits  for tasks assessed                      Exercises Total Joint Exercises Ankle Circles/Pumps: AROM;Both;15 reps;Supine Quad Sets: AROM;Both;10 reps;Supine Gluteal Sets: AROM;Both;10 reps;Supine Heel Slides: AAROM;Left;Supine;20 reps Hip ABduction/ADduction: AAROM;Left;Supine;20 reps Long Arc Quad: AROM;Both;15 reps;Seated    General Comments        Pertinent Vitals/Pain Pain Assessment: 0-10 Pain Score: 3  Pain Location: L hip/thigh Pain Descriptors / Indicators: Aching;Sore;Tightness Pain Intervention(s): Limited activity within patient's tolerance;Monitored during session;Premedicated before session;Ice applied    Home Living                      Prior Function            PT Goals (current goals can now be found in the care plan section) Acute Rehab PT Goals Patient Stated Goal: Resume previous lifestyle with decreased pain PT Goal Formulation: With patient Time For Goal Achievement: 01/02/15 Potential to Achieve Goals: Good Progress towards PT goals: Progressing toward goals    Frequency  7X/week    PT Plan Current plan remains appropriate    Co-evaluation             End of Session   Activity Tolerance: Patient tolerated treatment well Patient left: in bed;with call bell/phone within reach     Time: 0812-0858 PT Time Calculation (min) (ACUTE ONLY): 46 min  Charges:  $Gait Training: 8-22 mins $Therapeutic Exercise: 8-22 mins $Therapeutic Activity: 8-22 mins  G Codes:      Karlye Ihrig 2015-01-24, 9:09 AM

## 2015-09-14 IMAGING — DX DG PORTABLE PELVIS
1 series · 1 of 1 positions shown · non-contrast
Comparison: 08/15/2014

CLINICAL DATA: Status post left hip arthroplasty.

EXAM:
PORTABLE PELVIS 1-2 VIEWS

[pelvis ap]
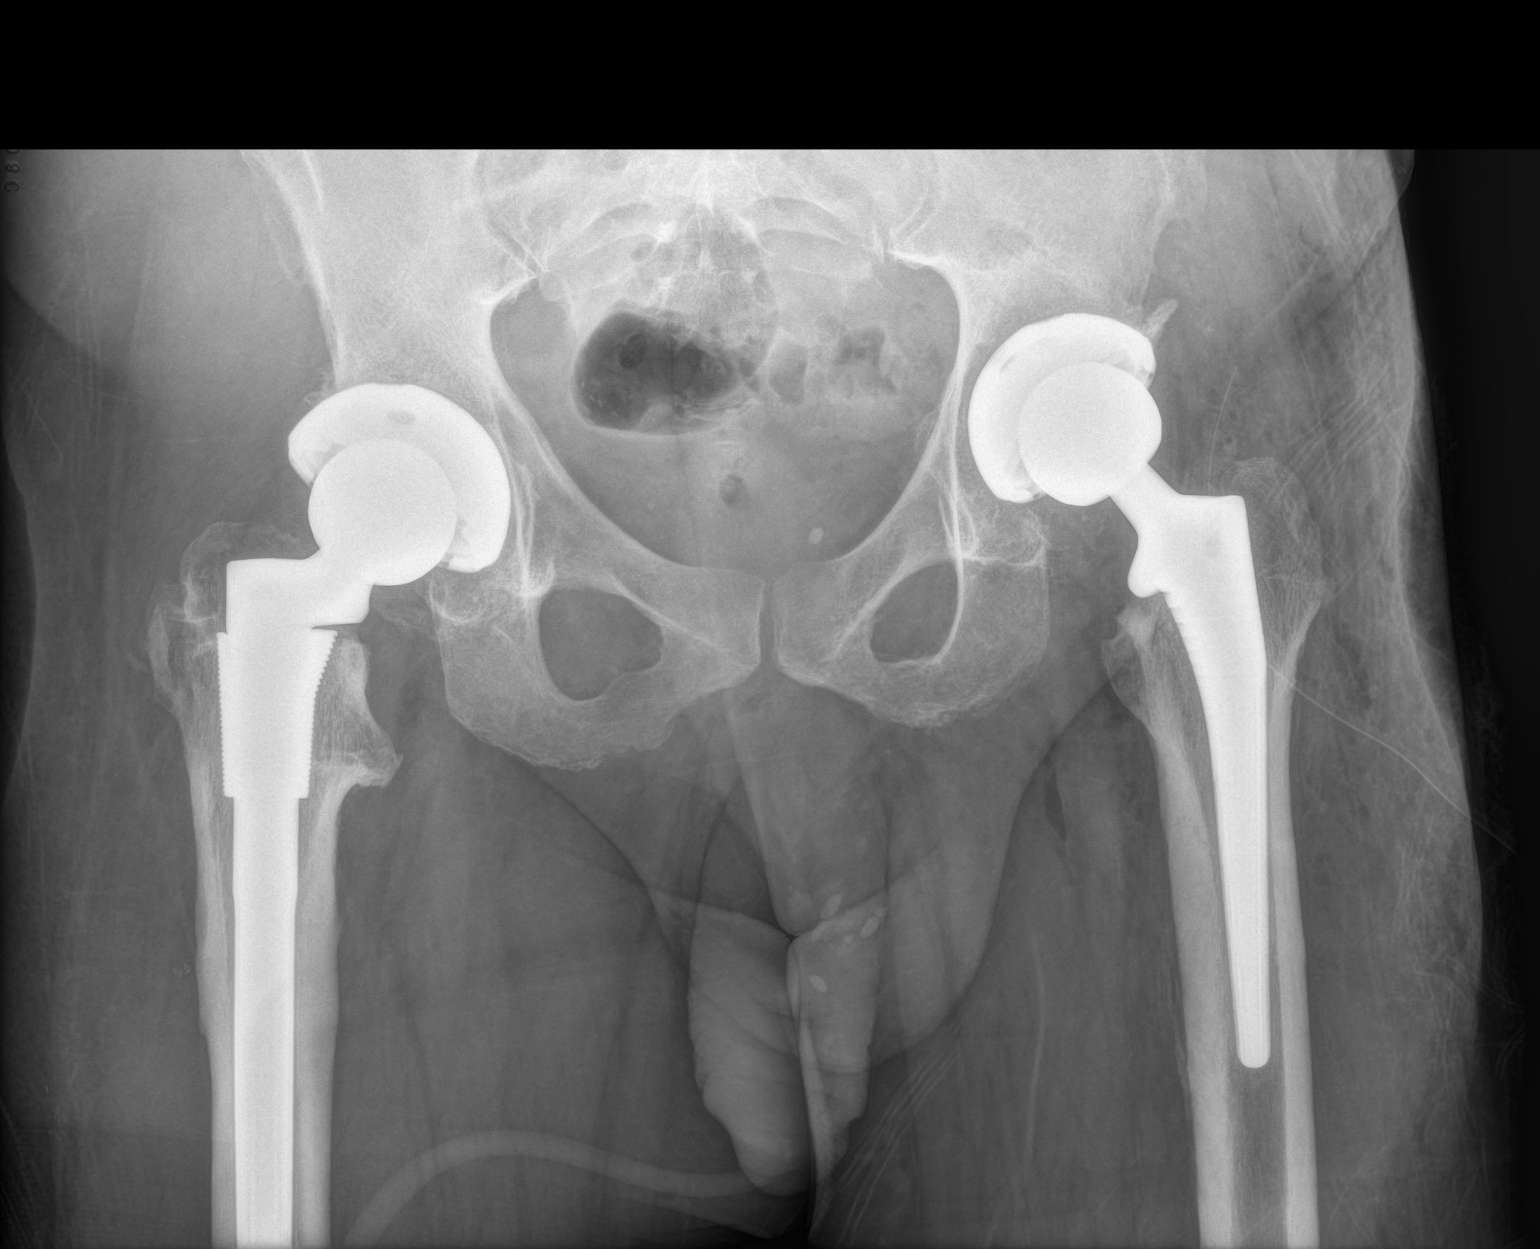

[1 of 1 positions shown; findings below may reference images not displayed]

FINDINGS: Alignment of new left hip arthroplasty appears normal. Old right hip
arthroplasty shows stable and normal alignment.
IMPRESSION: Normal alignment of newly placed left hip arthroplasty.
